# Patient Record
Sex: Female | Born: 1970
Health system: Southern US, Community
[De-identification: ages and names within clinical notes are randomized; demographics above are authoritative.]

## PROBLEM LIST (undated history)

## (undated) DIAGNOSIS — I10 Essential (primary) hypertension: Secondary | ICD-10-CM

## (undated) HISTORY — PX: FOOT FRACTURE SURGERY: SHX645

## (undated) HISTORY — DX: Essential (primary) hypertension: I10

## (undated) HISTORY — PX: FRACTURE SURGERY: SHX138

---

## 1998-11-08 ENCOUNTER — Other Ambulatory Visit: Admission: RE | Admit: 1998-11-08 | Discharge: 1998-11-08 | Payer: Self-pay | Admitting: Obstetrics and Gynecology

## 2000-03-21 ENCOUNTER — Other Ambulatory Visit: Admission: RE | Admit: 2000-03-21 | Discharge: 2000-03-21 | Payer: Self-pay | Admitting: Obstetrics and Gynecology

## 2002-06-05 ENCOUNTER — Emergency Department (HOSPITAL_COMMUNITY): Admission: EM | Admit: 2002-06-05 | Discharge: 2002-06-05 | Payer: Self-pay | Admitting: Emergency Medicine

## 2002-06-05 ENCOUNTER — Encounter: Payer: Self-pay | Admitting: Emergency Medicine

## 2003-06-03 ENCOUNTER — Emergency Department (HOSPITAL_COMMUNITY): Admission: EM | Admit: 2003-06-03 | Discharge: 2003-06-03 | Payer: Self-pay | Admitting: Emergency Medicine

## 2003-06-03 ENCOUNTER — Encounter: Payer: Self-pay | Admitting: Emergency Medicine

## 2003-11-16 ENCOUNTER — Encounter: Admission: RE | Admit: 2003-11-16 | Discharge: 2003-11-16 | Payer: Self-pay | Admitting: Family Medicine

## 2003-11-18 ENCOUNTER — Encounter: Admission: RE | Admit: 2003-11-18 | Discharge: 2003-11-18 | Payer: Self-pay | Admitting: Sports Medicine

## 2003-11-30 ENCOUNTER — Encounter: Admission: RE | Admit: 2003-11-30 | Discharge: 2003-11-30 | Payer: Self-pay | Admitting: Family Medicine

## 2003-12-18 ENCOUNTER — Emergency Department (HOSPITAL_COMMUNITY): Admission: EM | Admit: 2003-12-18 | Discharge: 2003-12-18 | Payer: Self-pay | Admitting: Emergency Medicine

## 2003-12-25 ENCOUNTER — Encounter: Admission: RE | Admit: 2003-12-25 | Discharge: 2003-12-25 | Payer: Self-pay | Admitting: Sports Medicine

## 2004-01-12 ENCOUNTER — Encounter: Admission: RE | Admit: 2004-01-12 | Discharge: 2004-01-12 | Payer: Self-pay | Admitting: Obstetrics and Gynecology

## 2004-01-19 ENCOUNTER — Ambulatory Visit (HOSPITAL_COMMUNITY): Admission: RE | Admit: 2004-01-19 | Discharge: 2004-01-19 | Payer: Self-pay | Admitting: *Deleted

## 2004-02-29 ENCOUNTER — Ambulatory Visit (HOSPITAL_COMMUNITY): Admission: RE | Admit: 2004-02-29 | Discharge: 2004-02-29 | Payer: Self-pay | Admitting: Obstetrics & Gynecology

## 2004-03-22 ENCOUNTER — Encounter: Admission: RE | Admit: 2004-03-22 | Discharge: 2004-03-22 | Payer: Self-pay | Admitting: Obstetrics and Gynecology

## 2004-06-13 ENCOUNTER — Ambulatory Visit: Payer: Self-pay | Admitting: Family Medicine

## 2004-09-21 ENCOUNTER — Ambulatory Visit: Payer: Self-pay | Admitting: Family Medicine

## 2004-10-04 ENCOUNTER — Ambulatory Visit: Payer: Self-pay | Admitting: Family Medicine

## 2004-10-12 ENCOUNTER — Encounter (INDEPENDENT_AMBULATORY_CARE_PROVIDER_SITE_OTHER): Payer: Self-pay | Admitting: *Deleted

## 2004-10-20 ENCOUNTER — Other Ambulatory Visit: Admission: RE | Admit: 2004-10-20 | Discharge: 2004-10-20 | Payer: Self-pay | Admitting: Family Medicine

## 2004-10-20 ENCOUNTER — Ambulatory Visit: Payer: Self-pay | Admitting: Family Medicine

## 2004-12-28 ENCOUNTER — Emergency Department (HOSPITAL_COMMUNITY): Admission: EM | Admit: 2004-12-28 | Discharge: 2004-12-28 | Payer: Self-pay | Admitting: Family Medicine

## 2005-02-23 ENCOUNTER — Ambulatory Visit: Payer: Self-pay | Admitting: Family Medicine

## 2006-04-06 ENCOUNTER — Ambulatory Visit: Payer: Self-pay | Admitting: Family Medicine

## 2006-11-08 DIAGNOSIS — F17200 Nicotine dependence, unspecified, uncomplicated: Secondary | ICD-10-CM | POA: Insufficient documentation

## 2006-11-08 DIAGNOSIS — M4150 Other secondary scoliosis, site unspecified: Secondary | ICD-10-CM

## 2006-11-08 DIAGNOSIS — J309 Allergic rhinitis, unspecified: Secondary | ICD-10-CM | POA: Insufficient documentation

## 2006-11-09 ENCOUNTER — Encounter (INDEPENDENT_AMBULATORY_CARE_PROVIDER_SITE_OTHER): Payer: Self-pay | Admitting: *Deleted

## 2007-04-24 ENCOUNTER — Ambulatory Visit: Payer: Self-pay | Admitting: Internal Medicine

## 2007-04-24 LAB — CONVERTED CEMR LAB
Alkaline Phosphatase: 67 units/L (ref 39–117)
Basophils Absolute: 0 10*3/uL (ref 0.0–0.1)
Basophils Relative: 0 % (ref 0–1)
CO2: 20 meq/L (ref 19–32)
Cholesterol: 162 mg/dL (ref 0–200)
Eosinophils Absolute: 0.2 10*3/uL (ref 0.0–0.7)
Eosinophils Relative: 1 % (ref 0–5)
Glucose, Bld: 75 mg/dL (ref 70–99)
HCT: 51 % — ABNORMAL HIGH (ref 36.0–46.0)
HDL: 54 mg/dL (ref >39)
Hemoglobin: 17.6 g/dL — ABNORMAL HIGH (ref 12.0–15.0)
MCHC: 34.5 g/dL (ref 30.0–36.0)
MCV: 96.4 fL (ref 78.0–100.0)
Neutrophils Relative %: 66 % (ref 43–77)
Potassium: 4.7 meq/L (ref 3.5–5.3)
RBC: 5.29 M/uL — ABNORMAL HIGH (ref 3.87–5.11)
RDW: 13.1 % (ref 11.5–14.0)
Sodium: 139 meq/L (ref 135–145)
Total CHOL/HDL Ratio: 3
Triglycerides: 189 mg/dL — ABNORMAL HIGH (ref <150)
VLDL: 38 mg/dL (ref 0–40)
WBC: 11.1 10*3/uL — ABNORMAL HIGH (ref 4.0–10.5)

## 2011-01-27 NOTE — Group Therapy Note (Signed)
[name], [name]                           ACCOUNT NO.:  [id number]   MEDICAL RECORD NO.:  [id number]                   PATIENT TYPE:  OUT   LOCATION:  [facility]                           FACILITY:  [facility]   PHYSICIAN:  Elsie Lincoln, MD                   DATE OF BIRTH:  04-Feb-1971   DATE OF SERVICE:  01/12/2004                                    CLINIC NOTE   HISTORY:  Patient a 40 year old para 1-0-1-1 female LMP December 31, 2003 who  presents for a presurgical evaluation for laparoscopic bilateral tubal  ligation.  Patient has been on OCPs for 11 years and desires no more  children however, she is a smoker so she can no longer take this method of  birth control.  She refuses IUD, condoms, diaphragm, NuvaRing.  She truly  wants to have a BTL.  Patient understands this is meant to be permanent and  not ever meant to be reversed and patient agrees this is her desires.   PAST MEDICAL HISTORY:  Denies.   PAST SURGICAL HISTORY:  Bone spurs in the heels removed and no problems with  anesthesia.   GYNECOLOGIC HISTORY:  No sexually transmitted diseases; one abnormal Pap  smear 1994 and underwent subsequent cryo - normal Pap smear since; no  fibroids; questionable history of ovarian cysts but no treatment for these.   ALLERGIES:  No known drug allergies.   MEDICATIONS:  OCPs.   SOCIAL HISTORY:  Smokes one pack per day tobacco, no alcohol or drugs.  No  problems with depression.  Not working and currently separated from her  husband.   ASSESSMENT AND PLAN:  [age]-year-old female para 1-0-1-1 desiring  permanent sterilization and evaluation of left lower quadrant pain that  patient has been experiencing for 6-8 months.   1. Transvaginal ultrasound to evaluate left adnexa.  2. Schedule for laparoscopic bilateral tubal ligation and diagnostic     laparoscopy, possible lysis of adhesions.  3. Thirty-day paper signed.                                               Elsie Lincoln, MD    KL/MEDQ  D:  01/12/2004  T:  01/13/2004  Job:  [id number]

## 2011-01-27 NOTE — Op Note (Signed)
NAME:  [name], [name]                         ACCOUNT NO.:  [id number]   MEDICAL RECORD NO.:  [id number]                   PATIENT TYPE:  AMB   LOCATION:  [facility]                                  [facility]ACILITY[facility]  [facility]   PHYSICIAN:  Lesly Dukes, M.D.              DATE OF BIRTH:  1970-11-29   DATE OF PROCEDURE:  02/29/2004  DATE OF DISCHARGE:                                 OPERATIVE REPORT   PREOPERATIVE DIAGNOSIS:  40 year old para 1-0-1-1, desiring permanent  sterilization as well as having intermittent left lower quadrant pain for 6-  8 months and a history of ovarian cyst.   POSTOPERATIVE DIAGNOSIS:  40 year old para 1-0-1-1, desiring permanent  sterilization as well as having intermittent left lower quadrant pain for 6-  8 months and a history of ovarian cyst.   PROCEDURE:  Laparoscopic bilateral tubal ligation and diagnostic  laparoscopy.   SURGEON:  Lesly Dukes, M.D.   ASSISTANT:  Shelbie Proctor. Shawnie Pons, M.D.   ANESTHESIA:  General.   ESTIMATED BLOOD LOSS:  Less than 30 mL.   COMPLICATIONS:  None.   PATHOLOGY:  None.   FINDINGS:  Normal ovaries bilaterally.  Normal fallopian tubes bilaterally.  No evidence of adhesions or old inflammatory disease.  Normal uterus.  Mild  increased pelvic congestion, left greater than right in the IP, questionable  old scarring in the posterior cul-de-sac, this could be questionably old  endometriosis, the patient is menstruating so there is retrograde  menstruation present, grossly normal appendix and grossly normal liver, no  adhesions at all to speak of, the printer on the laparoscopy equipment was  not working and could not take pictures.   DESCRIPTION OF PROCEDURE:  After informed consent was obtained, the patient  was taken to the operating room where general anesthesia was found to be  adequate.  The patient was placed in the dorsal supine position and prepped  and draped in the normal sterile fashion.  A bivalve speculum was placed  into the vagina and a Hulka clip was placed on the anterior lip of the  cervix.  An infraumbilical skin incision was made with the scalpel and  carried down to the layer of the fascia.  The fascia was tented up and  entered sharply with a scalpel.  This incision was carried down to the  peritoneum which was identified, tented up, and entered sharply with the  Mayo scissors.  The S-retractor was then placed into the intraperitoneal  cavity and a blunt #10 trocar was placed into the abdomen.  The laparoscope  was introduced into the abdomen and intraperitoneal placement was confirmed.  A pneumoperitoneum was achieved with CO2 to a pressure of 15.  The patient  was placed in Trendelenburg.  Using the operative scope, a Hulka clip was  placed on each fallopian tube.  Placement was verified by following the  fallopian tube out to its fimbriated end.  As  described in the above  findings, a survey of the pelvic anatomy was performed.  There was no  evidence of adhesions as described above.  Questionably, old endometriosis  as described above.  No pictures could be taken secondary to the printer on  the laparoscopy equipment not working.  At this point, the procedure was  ended.  The pneumoperitoneum was released and all instruments were removed  from the abdomen.  The fascia was identified and closed with 0 Vicryl in a  large figure-of-eight.  The skin was closed with 4-0 Vicryl in a  subcuticular fashion.  Good hemostasis was noted.  The Hulka clip was  removed from the cervix and the cervix noted to be hemostatic.  The patient  tolerated the procedure well.  Sponge, lap, instrument, and needle counts  were correct x 2.  The patient went to the recovery room in stable  condition.                                               Lesly Dukes, M.D.    Lora Paula  D:  02/29/2004  T:  02/29/2004  Job:  [id number]

## 2011-01-27 NOTE — Group Therapy Note (Signed)
NAME:  [name], [name]                         ACCOUNT NO.:  [id number]   MEDICAL RECORD NO.:  [id number]                   PATIENT TYPE:  OUT   LOCATION:  [facility]                           FACILITY:  [facility]   PHYSICIAN:  Elsie Lincoln, MD                   DATE OF BIRTH:  1971-04-09   DATE OF SERVICE:  03/22/2004                                    CLINIC NOTE   REASON FOR VISIT:  This 40 year old para 1-0-0-1 female who had a  laparoscopic bilateral tubal ligation on February 29, 2004 presents for follow-  up.  She had minimal discomfort after the surgery that required some  Percocet; however, the patient is doing fine now.  Also during the procedure  she was having some left lower quadrant pain for 6-8 months and states she  had a history of ovarian cyst.  As per the operative note which is in her  chart there was really nothing found, maybe increased pelvic congestion on  the left, and questionable old scarring in the posterior cul-de-sac of old  endometriosis.  The patient does not have pain with menstruation and the  pain is erratic and such that there is no pattern to the pain.  She has not  had a bowel or bladder workup.  She does not desire to have this at this  time.  She was offered a referral to Dr. Saralyn Pilar in [facility] and she  also refused this at this time.  She said she will come back if the pain is  worsened.  She is just relieved that there is no pathology that needs to be  dealt with.  [facility] and will return there  in September for her Pap smear.                                               Elsie Lincoln, MD    KL/MEDQ  D:  03/22/2004  T:  03/22/2004  Job:  [id number]

## 2011-03-17 ENCOUNTER — Emergency Department (HOSPITAL_COMMUNITY): Payer: No Typology Code available for payment source

## 2011-03-17 ENCOUNTER — Emergency Department (HOSPITAL_COMMUNITY)
Admission: EM | Admit: 2011-03-17 | Discharge: 2011-03-17 | Disposition: A | Discharge status: Home / Self Care | Payer: No Typology Code available for payment source | Attending: Emergency Medicine | Admitting: Emergency Medicine

## 2011-03-17 DIAGNOSIS — T148XXA Other injury of unspecified body region, initial encounter: Secondary | ICD-10-CM | POA: Insufficient documentation

## 2011-03-17 DIAGNOSIS — R0789 Other chest pain: Secondary | ICD-10-CM | POA: Insufficient documentation

## 2011-03-17 DIAGNOSIS — M549 Dorsalgia, unspecified: Secondary | ICD-10-CM | POA: Insufficient documentation

## 2011-03-17 DIAGNOSIS — S20219A Contusion of unspecified front wall of thorax, initial encounter: Secondary | ICD-10-CM | POA: Insufficient documentation

## 2011-05-13 ENCOUNTER — Emergency Department (HOSPITAL_COMMUNITY): Payer: Self-pay

## 2011-05-13 ENCOUNTER — Inpatient Hospital Stay (HOSPITAL_COMMUNITY)
Admission: EM | Admit: 2011-05-13 | Discharge: 2011-05-19 | DRG: 494 | Disposition: A | Discharge status: Skilled Nursing Facility | Payer: MEDICAID | Attending: Orthopedic Surgery | Admitting: Orthopedic Surgery

## 2011-05-13 DIAGNOSIS — F1010 Alcohol abuse, uncomplicated: Secondary | ICD-10-CM | POA: Diagnosis present

## 2011-05-13 DIAGNOSIS — G4700 Insomnia, unspecified: Secondary | ICD-10-CM | POA: Diagnosis present

## 2011-05-13 DIAGNOSIS — S82109A Unspecified fracture of upper end of unspecified tibia, initial encounter for closed fracture: Principal | ICD-10-CM | POA: Diagnosis present

## 2011-05-13 DIAGNOSIS — Y92009 Unspecified place in unspecified non-institutional (private) residence as the place of occurrence of the external cause: Secondary | ICD-10-CM

## 2011-05-13 DIAGNOSIS — F17200 Nicotine dependence, unspecified, uncomplicated: Secondary | ICD-10-CM | POA: Diagnosis present

## 2011-05-13 DIAGNOSIS — Y998 Other external cause status: Secondary | ICD-10-CM

## 2011-05-13 DIAGNOSIS — E876 Hypokalemia: Secondary | ICD-10-CM | POA: Diagnosis present

## 2011-05-13 DIAGNOSIS — M948X9 Other specified disorders of cartilage, unspecified sites: Secondary | ICD-10-CM | POA: Diagnosis present

## 2011-05-13 DIAGNOSIS — W010XXA Fall on same level from slipping, tripping and stumbling without subsequent striking against object, initial encounter: Secondary | ICD-10-CM | POA: Diagnosis present

## 2011-05-13 LAB — BASIC METABOLIC PANEL
BUN: 9 mg/dL (ref 6–23)
CO2: 21 mEq/L (ref 19–32)
Calcium: 8.4 mg/dL (ref 8.4–10.5)
Chloride: 99 mEq/L (ref 96–112)
Creatinine, Ser: 0.62 mg/dL (ref 0.50–1.10)
GFR calc Af Amer: >60 mL/min (ref >60)
GFR calc Af Amer: >60 mL/min (ref >60)
GFR calc non Af Amer: >60 mL/min (ref >60)
Glucose, Bld: 116 mg/dL — ABNORMAL HIGH (ref 70–99)
Glucose, Bld: 103 mg/dL — ABNORMAL HIGH (ref 70–99)
Potassium: 2.7 mEq/L — CL (ref 3.5–5.1)
Potassium: 3.7 mEq/L (ref 3.5–5.1)
Sodium: 138 mEq/L (ref 135–145)

## 2011-05-13 LAB — URINALYSIS, ROUTINE W REFLEX MICROSCOPIC
Bilirubin Urine: NEGATIVE
Glucose, UA: NEGATIVE mg/dL
Specific Gravity, Urine: 1.011 (ref 1.005–1.030)
pH: 5.5 (ref 5.0–8.0)

## 2011-05-13 LAB — CBC
HCT: 42.4 % (ref 36.0–46.0)
MCH: 34.1 pg — ABNORMAL HIGH (ref 26.0–34.0)
MCV: 95.7 fL (ref 78.0–100.0)
Platelets: 185 10*3/uL (ref 150–400)
RBC: 4.43 MIL/uL (ref 3.87–5.11)

## 2011-05-13 LAB — DIFFERENTIAL
Eosinophils Absolute: 0.1 10*3/uL (ref 0.0–0.7)
Lymphocytes Relative: 20 % (ref 12–46)
Lymphs Abs: 2.2 10*3/uL (ref 0.7–4.0)
Monocytes Relative: 4 % (ref 3–12)
Neutrophils Relative %: 75 % (ref 43–77)

## 2011-05-13 LAB — URINE MICROSCOPIC-ADD ON

## 2011-05-14 LAB — URINE CULTURE: Culture  Setup Time: 201209011126

## 2011-05-15 ENCOUNTER — Inpatient Hospital Stay (HOSPITAL_COMMUNITY): Payer: Self-pay

## 2011-05-16 ENCOUNTER — Inpatient Hospital Stay (HOSPITAL_COMMUNITY): Payer: Self-pay

## 2011-05-16 ENCOUNTER — Other Ambulatory Visit (HOSPITAL_COMMUNITY): Payer: No Typology Code available for payment source

## 2011-05-16 LAB — BASIC METABOLIC PANEL
BUN: 5 mg/dL — ABNORMAL LOW (ref 6–23)
Calcium: 9.2 mg/dL (ref 8.4–10.5)
Potassium: 4.3 mEq/L (ref 3.5–5.1)

## 2011-05-16 LAB — CBC
HCT: 37.7 % (ref 36.0–46.0)
MCHC: 35.3 g/dL (ref 30.0–36.0)
RDW: 13 % (ref 11.5–15.5)

## 2011-05-16 LAB — SURGICAL PCR SCREEN
MRSA, PCR: NEGATIVE
Staphylococcus aureus: NEGATIVE

## 2011-05-17 LAB — COMPREHENSIVE METABOLIC PANEL
AST: 26 U/L (ref 0–37)
Albumin: 2.6 g/dL — ABNORMAL LOW (ref 3.5–5.2)
Calcium: 9.4 mg/dL (ref 8.4–10.5)
Chloride: 98 mEq/L (ref 96–112)
Creatinine, Ser: 0.57 mg/dL (ref 0.50–1.10)
Total Protein: 6.4 g/dL (ref 6.0–8.3)

## 2011-05-17 LAB — PREALBUMIN: Prealbumin: 11 mg/dL — ABNORMAL LOW (ref 17.0–34.0)

## 2011-05-18 LAB — IRON AND TIBC
Iron: 40 ug/dL — ABNORMAL LOW (ref 42–135)
Saturation Ratios: 14 % — ABNORMAL LOW (ref 20–55)
UIBC: 243 ug/dL (ref 125–400)

## 2011-05-24 NOTE — H&P (Signed)
  NAMEAAMYA, [name] NO.:  [id number]  MEDICAL RECORD NO.:  [id number]  LOCATION:  [facility]                         FACILITY:  [facility]  PHYSICIAN:  Burnard Bunting, M.D.    DATE OF BIRTH:  1970-12-06  DATE OF ADMISSION:  05/13/2011 DATE OF DISCHARGE:                             HISTORY & PHYSICAL   CHIEF COMPLAINT:  Left leg pain.  HISTORY OF PRESENT ILLNESS:  [name] is a 40 year old patient with left leg pain, as she had a fall this morning in her house.  Denies any loss of consciousness.  Reports no other orthopedic complaints other than left leg pain, inability to weightbear.  Denies numbness or tingling.  PAST MEDICAL HISTORY:  Negative.  PAST SURGICAL HISTORY:  Negative.  ALLERGIES:  She is allergic to CODEINE.  SOCIAL HISTORY:  The patient does report positive EtOH use, smoking about one pack per day.  Has family in [place].  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  REVIEW OF SYSTEMS:  All other systems reviewed and negative except right to left leg.  PHYSICAL EXAMINATION:  GENERAL:  She is well-developed, well-nourished in no acute distress.  Alert and oriented. VITAL SIGNS:  Blood pressure 158/90, respirations 14, heart rate 80. CHEST:  Clear to auscultation. HEART:  Regular rate and rhythm. ABDOMEN:  Benign. EXTREMITIES:  Left extremity compartments are soft anterior and posterior.  Some swelling is present.  No pain with passive dorsiflexion and plantar flexion of the toes.  DP pulse 2+/4.  Sensation is okay on the dorsal plantar aspect of the foot.  No groin pain in internal extension of the leg.  The right lower extremity, bilateral upper extremities have full range of motion without pain.  X-rays show comminuted bicondylar tibial plateau fracture, distal femur is intact.  Chest x-ray is negative.  CT scan that confirmed the significant comminution.  UA is negative.  Sodium and potassium are  134 and 2.7.  BUN and creatinine 9 and 0.62.  EtOH level 119.  White count 11,000, hemoglobin 15, platelets 185.  IMPRESSION:  Left tibial plateau fracture and hypokalemia.  PLAN:  Supplementation in the ER of her hypokalemia with subsequent external fixation and reduction of the left tibial plateau fracture.  We will keep her on bilateral foot puncture DVT prophylaxis due to the concern about precipitating compartment syndrome with pharmacologic DVT prophylaxis.  I have discussed the case with Dr. Daneil [name] who will take care of her on Tuesday with internal fixation would be performed at that time.     Burnard Bunting, M.D.     GSD/MEDQ  D:  05/13/2011  T:  05/13/2011  Job:  [id number]  Electronically Signed by [name].  [name] M.D. on 05/24/2011 08:34:20 AM

## 2011-05-24 NOTE — Op Note (Signed)
  [name], [name]               ACCOUNT NO.:  [id number]  MEDICAL RECORD NO.:  [id number]  LOCATION:  [facility]                         FACILITY:  [facility]  PHYSICIAN:  Burnard Bunting, M.D.    DATE OF BIRTH:  1970/09/28  DATE OF PROCEDURE:  05/13/2011 DATE OF DISCHARGE:                              OPERATIVE REPORT   PREOPERATIVE DIAGNOSIS:  Left tibial plateau fracture.  POSTOPERATIVE DIAGNOSIS:  Left tibial plateau fracture.  PROCEDURE:  Reduction and external fixation, left tibial plateau fracture.  SURGEON:  Burnard Bunting, MD  ASSISTANT:  None.  ANESTHESIA:  General endotracheal.  ESTIMATED BLOOD LOSS:  Minimal.  INDICATION:  The patient fell and had comminuted bicondylar tibial plateau fracture on the left, presents now for temporizing external fixation, pinning at the tendon of internal fixation.  PROCEDURE IN DETAIL:  The patient was brought to the operating room where general endotracheal was used.  Preoperative antibiotics administered.  Left leg was pre-scrubbed with alcohol and Betadine, which was allowed to air dry, prepped with DuraPrep solution and draped in a sterile manner.  Time-out was called.  Two pins were then placed about 6-7 cm below the tibial shaft fracture, 2 pins were then placed at the proximal lateral outside of the knee joint into the femoral shaft. Correct location was confirmed using fluoroscopic guidance.  The pins were placed, external fixation was then placed, and the fracture was reduced.  Part of our tendon bar connections were then tightened.  Good reduction was achieved.  The incision was then applied to the pin sites to the skin.  Bulky dressing was applied.  The patient tolerated the procedure well without immediate complications.     Burnard Bunting, M.D.     GSD/MEDQ  D:  05/13/2011  T:  05/13/2011  Job:  [id number]  Electronically Signed by Reece Agar.  DEAN M.D. on 05/24/2011 08:34:23 AM

## 2011-05-31 ENCOUNTER — Encounter (HOSPITAL_COMMUNITY)
Admission: RE | Admit: 2011-05-31 | Discharge: 2011-05-31 | Disposition: A | Discharge status: Home / Self Care | Payer: No Typology Code available for payment source | Source: Ambulatory Visit | Attending: Orthopedic Surgery | Admitting: Orthopedic Surgery

## 2011-05-31 LAB — CBC
HCT: 42.9 % (ref 36.0–46.0)
Hemoglobin: 14.4 g/dL (ref 12.0–15.0)
MCH: 32.7 pg (ref 26.0–34.0)
MCV: 97.5 fL (ref 78.0–100.0)
RBC: 4.4 MIL/uL (ref 3.87–5.11)

## 2011-05-31 LAB — URINALYSIS, ROUTINE W REFLEX MICROSCOPIC
Glucose, UA: NEGATIVE mg/dL
Protein, ur: NEGATIVE mg/dL
pH: 5 (ref 5.0–8.0)

## 2011-05-31 LAB — COMPREHENSIVE METABOLIC PANEL
ALT: 17 U/L (ref 0–35)
AST: 24 U/L (ref 0–37)
Albumin: 3.5 g/dL (ref 3.5–5.2)
CO2: 26 mEq/L (ref 19–32)
Calcium: 9.7 mg/dL (ref 8.4–10.5)
GFR calc non Af Amer: >60 mL/min (ref >60)
Sodium: 138 mEq/L (ref 135–145)
Total Protein: 6.5 g/dL (ref 6.0–8.3)

## 2011-05-31 LAB — HCG, SERUM, QUALITATIVE: Preg, Serum: NEGATIVE

## 2011-05-31 LAB — URINE MICROSCOPIC-ADD ON

## 2011-05-31 LAB — DIFFERENTIAL
Lymphs Abs: 3.2 10*3/uL (ref 0.7–4.0)
Monocytes Relative: 5 % (ref 3–12)
Neutro Abs: 9.8 10*3/uL — ABNORMAL HIGH (ref 1.7–7.7)
Neutrophils Relative %: 70 % (ref 43–77)

## 2011-05-31 LAB — PROTIME-INR: Prothrombin Time: 12.9 seconds (ref 11.6–15.2)

## 2011-06-01 ENCOUNTER — Other Ambulatory Visit (HOSPITAL_COMMUNITY): Payer: No Typology Code available for payment source

## 2011-06-01 ENCOUNTER — Inpatient Hospital Stay (HOSPITAL_COMMUNITY)
Admission: RE | Admit: 2011-06-01 | Discharge: 2011-06-04 | DRG: 488 | Disposition: A | Discharge status: Home Health | Payer: Self-pay | Source: Ambulatory Visit | Attending: Orthopedic Surgery | Admitting: Orthopedic Surgery

## 2011-06-01 ENCOUNTER — Inpatient Hospital Stay (HOSPITAL_COMMUNITY): Payer: Self-pay

## 2011-06-01 DIAGNOSIS — F17200 Nicotine dependence, unspecified, uncomplicated: Secondary | ICD-10-CM | POA: Diagnosis present

## 2011-06-01 DIAGNOSIS — S82209A Unspecified fracture of shaft of unspecified tibia, initial encounter for closed fracture: Secondary | ICD-10-CM | POA: Diagnosis present

## 2011-06-01 DIAGNOSIS — Z4789 Encounter for other orthopedic aftercare: Secondary | ICD-10-CM

## 2011-06-01 DIAGNOSIS — M23302 Other meniscus derangements, unspecified lateral meniscus, unspecified knee: Secondary | ICD-10-CM | POA: Diagnosis present

## 2011-06-01 DIAGNOSIS — W1789XA Other fall from one level to another, initial encounter: Secondary | ICD-10-CM | POA: Diagnosis present

## 2011-06-01 DIAGNOSIS — N390 Urinary tract infection, site not specified: Secondary | ICD-10-CM | POA: Diagnosis present

## 2011-06-01 DIAGNOSIS — Y92009 Unspecified place in unspecified non-institutional (private) residence as the place of occurrence of the external cause: Secondary | ICD-10-CM

## 2011-06-01 DIAGNOSIS — S82109A Unspecified fracture of upper end of unspecified tibia, initial encounter for closed fracture: Principal | ICD-10-CM | POA: Diagnosis present

## 2011-06-01 DIAGNOSIS — Z01812 Encounter for preprocedural laboratory examination: Secondary | ICD-10-CM

## 2011-06-01 LAB — URINALYSIS, ROUTINE W REFLEX MICROSCOPIC
Glucose, UA: NEGATIVE mg/dL
Specific Gravity, Urine: 1.024 (ref 1.005–1.030)
pH: 5 (ref 5.0–8.0)

## 2011-06-01 LAB — URINE MICROSCOPIC-ADD ON

## 2011-06-01 LAB — URINE CULTURE

## 2011-06-02 LAB — CBC
MCH: 31.8 pg (ref 26.0–34.0)
MCHC: 32 g/dL (ref 30.0–36.0)
MCV: 99.4 fL (ref 78.0–100.0)
Platelets: 380 10*3/uL (ref 150–400)
RDW: 14.6 % (ref 11.5–15.5)

## 2011-06-02 LAB — BASIC METABOLIC PANEL
Calcium: 8.4 mg/dL (ref 8.4–10.5)
Creatinine, Ser: <0.47 mg/dL — ABNORMAL LOW (ref 0.50–1.10)

## 2011-06-03 LAB — BASIC METABOLIC PANEL
BUN: 5 mg/dL — ABNORMAL LOW (ref 6–23)
Calcium: 8.4 mg/dL (ref 8.4–10.5)
Chloride: 105 mEq/L (ref 96–112)
Creatinine, Ser: <0.47 mg/dL — ABNORMAL LOW (ref 0.50–1.10)

## 2011-06-03 LAB — PROTIME-INR: Prothrombin Time: 15.5 seconds — ABNORMAL HIGH (ref 11.6–15.2)

## 2011-06-03 LAB — CBC
HCT: 27.8 % — ABNORMAL LOW (ref 36.0–46.0)
MCHC: 32.7 g/dL (ref 30.0–36.0)
RDW: 14.9 % (ref 11.5–15.5)

## 2011-06-04 LAB — PROTIME-INR: INR: 1.73 — ABNORMAL HIGH (ref 0.00–1.49)

## 2011-06-19 ENCOUNTER — Ambulatory Visit (HOSPITAL_COMMUNITY)
Admission: RE | Admit: 2011-06-19 | Discharge: 2011-06-19 | Disposition: A | Discharge status: Home / Self Care | Payer: Self-pay | Source: Ambulatory Visit | Attending: Orthopedic Surgery | Admitting: Orthopedic Surgery

## 2011-06-19 DIAGNOSIS — M7989 Other specified soft tissue disorders: Secondary | ICD-10-CM

## 2011-06-19 DIAGNOSIS — M79609 Pain in unspecified limb: Secondary | ICD-10-CM

## 2011-06-20 NOTE — Op Note (Signed)
NAMEBRANDACE, [name]               ACCOUNT NO.:  [id number]  MEDICAL RECORD NO.:  [id number]  LOCATION:  5011                         FACILITY:  [facility]  PHYSICIAN:  Doralee Albino. Carola Frost, M.D. DATE OF BIRTH:  08-Nov-1970  DATE OF PROCEDURE:  05/16/2011 DATE OF DISCHARGE:                              OPERATIVE REPORT   PREOPERATIVE DIAGNOSIS:  Left Schatzker 6 tibial plateau fracture, status post external fixation.  POSTOPERATIVE DIAGNOSIS:  Left Schatzker 6 tibial plateau fracture, status post external fixation.  PROCEDURE:  Revision external fixation, left leg.  SURGEON:  Doralee Albino. Carola Frost, MD  ASSISTANT:  Mearl Latin, PA  ANESTHESIA:  General.  COMPLICATIONS:  None.  ESTIMATED BLOOD LOSS:  Minimal.  DISPOSITION:  PACU.  CONDITION:  Stable.  BRIEF SUMMARY AND INDICATION FOR PROCEDURE:  [name] is a 40 year old female status post severely comminuted left tibial plateau fracture treated initially by Dr. Dorene Grebe.  Subsequent x-rays showed overall reasonable alignment, but with a close proximity of the more proximal of the two tibial pins to the fracture site.  Also, the more distal of the 2 femoral clamps was quite close to the skin following her progressive swelling.  Consequently, we discussed with her the need for revision external fixation to change one of the pins and also change a clamp. The patient did wish to proceed with this.  Because of the construct, she did require an this to be done in the OR.  She understood those complications to include pin tract infection, nerve injury, vessel injury, need for further surgery, DVT, PE, and others.  BRIEF SUMMARY OF PROCEDURE:  [name] was administered a gram of Ancef and taken to the operating room where general anesthesia was induced. Her left lower extremity was prepped and draped in the usual sterile fashion.  A new ex fix pin was placed more distal far away from the fracture site than the more proximal of  the tibial pins.  The more proximal tibial pin was then removed.  The position of the external fixator pin was confirmed on AP and lateral projections of the C-arm. Reduction maneuver consisting of traction and manipulation was then performed to reduce the fracture site.  All of the clamps were then secured in place.  Additional bar with bar-bar construct was then placed to add additional control of the fracture.  Final images again showed appropriate reduction and alignment.  The patient was awakened from anesthesia and transported to PACU in stable condition after an irrigation and simple nylon closure of the ex fix pin sites and application of sterile dressing.  PROGNOSIS:  [name] will be managed in the fixator until soft tissue swelling resolution has occurred and then she will return for definitive internal fixation.  CT scan need to be repeated and this was obtained prior to external fixation and a dual approach with the medial buttress plate for the posterior shear component as well as a lateral plate is anticipated.  She will be on pharmacologic DVT prophylaxis until that time.     Doralee Albino. Carola Frost, M.D.     MHH/MEDQ  D:  05/16/2011  T:  05/16/2011  Job:  [id number]  Electronically Signed by Myrene Galas M.D. on 06/20/2011 06:45:13 PM
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
## 2011-06-20 NOTE — Discharge Summary (Signed)
NAMECONTINA, STRAIN               ACCOUNT NO.:  [id number]  MEDICAL RECORD NO.:  [id number]  LOCATION:  5011                         FACILITY:  [facility]  PHYSICIAN:  Doralee Albino. Carola Frost, M.D. DATE OF BIRTH:  1971-06-09  DATE OF ADMISSION:  05/13/2011 DATE OF DISCHARGE:  05/18/2011                        DISCHARGE SUMMARY - REFERRING   DISCHARGE DIAGNOSES:  Left Schatzker VI tibial plateau fracture.  ADDITIONAL DISCHARGE DIAGNOSES: 1. Nicotine dependence. 2. Alcohol dependence. 3. Presumed metabolic bone disease.  PROCEDURES PERFORMED:  On May 13, 2011, spanning external fixation of left tibial plateau by Dr. Dorene Grebe.  May 16, 2011, revision of external fixation, left leg by Dr. Carola Frost.  BRIEF HISTORY AND HOSPITAL COURSE:  Ms. Gugel is a 40 year old Caucasian female, sustained ground-level fall on May 13, 2011.  She is brought to [facility] and was found to have a complex left tibial plateau fracture.  She was initially brought to the operating room by Dr. August Saucer for reduction and application of external fixator. Given the complexity of the injury, Orthopedic Trauma Service was consulted for definitive management.  She was seen and evaluated on May 16, 2011 and was found to be somewhat short.  With respect to her fracture, she was brought back to the operating room for revision of her external fixation with addition of additional carbon fiber rods to provide additional stability to reduction.  The patient was brought back to the orthopedic floor for continued observation and pain control as well as the physical therapy for mobilization.  The patient was started on Lovenox as well for DVT prophylaxis.  She was also started on admission on the Ativan protocol for DVT prophylaxis.  The patient's hospital stay was relatively uncomplicated.  However, given her limited social support, she was deemed to be a candidate for a skilled nursing facility as we  awaited soft tissue healing for eventual definitive ORIF. The patient was also fitted with a Orthoplast footplate to maintain her ankle in a plantigrade position to prevent development of equinus contracture.  Again, given the patient's social history of heavy smoking and alcohol use, we initiated a metabolic bone workup given the relatively low-energy mechanism at current time.  TSH intact.  PTH and vitamin D panel is pending as is a anemia panel.  At current time, her prealbumin and albumin are both depressed or decreased.  Prealbumin is 11.0 and albumin is 2.6.  Overall, the patient is doing very well.  He was deemed to be stable for discharge on May 18, 2011 to a short- term nursing center while we await soft tissue healing before proceeding with definitive ORIF.  Given the complexity of the fracture involving both condyles as well as a posterior shear to the medial tibial plateau, the patient will require a dual plating for restoration of stability and will also require fairly significant bone grafting given the significant depression present as well.  Clinical encounter note for postoperative day #2 is as follows.  OBJECTIVE:  The patient is doing better.  No acute changes. VITAL SIGNS:  Temperature 98.0, heart rate 89, respirations 80-98% on room air, BP is 144/83.  Prealbumin is 11.0, ionized calcium is normal at 1.22, albumin  is low at 2.6, phosphorus is normal at 4.4, magnesium is normal at 2.1. GENERAL:  The patient is sitting in bedside chair, in no acute distress, appears to be very comfortable. LUNGS:  Clear bilaterally. CARDIAC:  S1 and S2 noted. ABDOMEN:  Soft, nontender with positive bowel sounds. EXTREMITIES:  Left lower extremity dressing is stable.  Pin site is excellent.  Ex-Fix is stable.  Deep peroneal nerve, superficial peroneal nerve, and tibial nerve sensory function are intact.  EHL, FHL, anterior tibialis, posterior tibialis, peroneals, gastroc-soleus  complex motor function intact.  No deep calf tenderness.  Compartments soft and nontender.  No pain with passive stretch.  ASSESSMENT/PLAN:  A 40 year old female status post fall. 1. Left bicondylar tibial plateau fracture status post external     fixation, possible with nonweightbearing.  The patient will require     soft tissue rest in about 10-14 days for definitive plate     osteosynthesis.  The patient is stable at current time for     discharge to a short-term skilled nursing facility.  The patient is     encouraged to perform toe and ankle range of motion as tolerated.     Footplate is to not working on range of motion, aggressive ice and     elevation as well as Ace wrap. 2. Nicotine dependence.  No supplements.  Encourage cessation. 3. Ethyl alcohol dependence.  The patient completed Ativan protocol.     Continue monitoring. 4. Pain.  Continue current regimen. 5. SVN.  Continue regular diet.  The patient likely requires some     additional nutritional support such as meal supplementation shakes     b.i.d. or t.i.d. with meals to help restore nutrients and to     encourage healing.  We will start the patient on vitamin C 500 mg     p.o. daily, vitamin D3 1000 international units p.o. b.i.d., and     calcium citrate 600 mg p.o. b.i.d.  Once metabolic bone panel is     completed, we will adjust accordingly. 6. Pain.  Continue her current regimen. 7. Deep vein thrombosis and pulmonary embolism, prophylaxis.  Continue     with Lovenox 40 mg subcu daily for DVT, PE prophylaxis. 8. Disposition.  The patient is stable for SNF.  Follow up with     Orthopedics in 7 days for soft tissue check and scheduling for     surgery.  DISCHARGE MEDICATIONS: 1. Percocet 5/325 one to two p.o. q.6 h. as needed for pain. 2. Oxycodone 5 mg 1-2 p.o. q.3 h. as needed for breakthrough pain. 3. Calcium citrate 600 mg 1 p.o. b.i.d. 4. Lovenox 40 mg 1 subcutaneous injection daily. 5. Robaxin 500 mg 1-2  p.o. q.8 h. as needed. 6. Multivitamin 1 p.o. daily. 7. Vitamin C 500 mg 1 p.o. daily. 8. Vitamin D3 1000 IUs p.o. b.i.d.  DISCHARGE INSTRUCTIONS AND PLAN:  Mrs. Dieu sustained a very severe injury to her left tibial plateau with extensive joint involvement including severe comminution and depression over joint.  She is at increased risk for the development of severe post-traumatic arthritis. However, we are hopeful that her soft tissue recover with an extended period of time, so that we may proceed with definitive ORIF of her left tibial plateau to help restore stability alignment, joint surface congruity, and evaluate her meniscus need be, so the effects of any post- traumatic arthritis are limited.  The patient will be nonweightbearing after definitive fixation for 8 weeks at least  given her extensive smoking and alcohol use history.  I would expect complete union to occur around 12-week mark.  The patient will be nonweightbearing for the time being while she is in the external fixator as well.  Aggressive ice elevation, Ace wrap should be maintained to help with swelling resolution as well.  The patient remained on Lovenox for DVT and PE prophylaxis during this soft tissue healing phase and will likely be on Lovenox versus Coumadin after surgery.  Depending on the level of mobility, we will make that determination at the time of surgery shortly thereafter.  The patient will continue on a regular diet as well.  She is tolerating this and has been voiding well and has had bowel movement. The patient will continue to work with physical therapy even though she is in an external fixator.  I want the patient be mobile as possible, bed to chair as well as ambulating as well.  Again, nonweightbearing on her left leg.  Again, we are checking a metabolic bone panel to evaluate for any additional secondary causes for her poor density.  The patient is relatively young, if she has degree of  osteoporosis is likely not primary and is likely related to her smoking and alcohol use, but again, I would like to evaluate for any additional endocrine dysfunction or other potential causes.  The patient does not appear to have any renal disease contributing to poor bone density and again is likely therefore secondary to modifiable factors such as smoking and alcohol abuse.  The patient will remain on supplementation with vitamin D, calcium, and we will adjust accordingly to labs.  With respect to wound care, the patient should have daily pin site care performed at the nursing home, which should include cleaning of the pin sites with soap and water, removal of any coagulant from the pin sites to allow for drainage.  No ointments or Xeroform should be applied to the pin sites, so as to allow adequate drainage.  Pin site should be wrapped with Kerlix gauze from skin all the way up to the pin bar interface.  This will help limit pin motion at the skin.  Legs should be also wrapped with an Ace wrap from foot to side as well.  Should the nursing home have any questions prior to her followup or during any or for any reason whatsoever regarding soft-tissue tear and external fixator care.  They are to contact the office.  It is okay for the patient to be removed by her Ex-Fix as well to facilitate mobilization.     Mearl Latin, PA   ______________________________ Doralee Albino. Carola Frost, M.D.    KWP/MEDQ  D:  05/18/2011  T:  05/18/2011  Job:  [id number]  Electronically Signed by Montez Morita PA on 05/29/2011 02:05:41 PM Electronically Signed by Myrene Galas M.D. on 06/20/2011 06:45:18 PM

## 2011-06-20 NOTE — Consult Note (Signed)
NAMESENA, HOOPINGARNER               ACCOUNT NO.:  [id number]  MEDICAL RECORD NO.:  [id number]  LOCATION:  5011                         FACILITY:  [facility]  PHYSICIAN:  Doralee Albino. Carola Frost, M.D. DATE OF BIRTH:  1971/06/07  DATE OF CONSULTATION:  05/16/2011 DATE OF DISCHARGE:                                CONSULTATION   REQUESTING PHYSICIAN:  Burnard Bunting, MD, Orthopedics.  REASON FOR CONSULTATION:  Complex left tibial plateau fracture.  BRIEF HISTORY OF PRESENT ILLNESS:  Ms. Rabelo is a 40 year old Caucasian female with a history of nicotine and alcohol use, who was at home on May 13, 2011, when she reportedly jumped off her deck, landed on her left leg in the rocks beneath her, gave way.  She sustained injury to her left lower extremity.  The patient was brought to [facility] for evaluation.  She was seen and evaluated by Dr. August Saucer in the emergency department.  It was noted that the patient did have a blood alcohol level of 118.  Given the severe comminution and complexity of her tibial plateau fracture as well as soft tissue swelling, she was taken urgently to the operating room for placement of external fixator to help restore length and stabilize her fracture.  The patient was also started on DT prophylaxis as well.  Given the complexity of the injury, the Orthopedic Trauma Service and Dr. Carola Frost was consulted for definitive management for her fracture.  Currently, Ms. Robinson is in room 5011 and complains of left lower extremity pain.  Denies injuries elsewhere.  No additional complaints are noted.  She denies any chest pain or shortness of breath.  No nausea, vomiting.  No recent illnesses.  She denies any numbness or tingling in her left lower extremity.  Reports that motor and sensory functions are intact in her left lower extremity as well.  PAST MEDICAL HISTORY:  The patient denies.  PAST SURGICAL HISTORY:  The patient does have pain in her right foot secondary  to previous fracture.  She has had bone spurs from her heels and wound as well.  ALLERGIES:  Reports allergy to CODEINE.  SOCIAL HISTORY:  The patient does use alcohol, she drinks 2-3 days a week, 4-5 beers at a time.  She does smoke approximately one pack per day.  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 Currently not working.  MEDICATIONS:  Occasional ibuprofen secondary to painful menses.  REVIEW OF SYSTEMS:  As noted above in the HPI.  PHYSICAL EXAMINATION:  VITAL SIGNS:  Temperature 98.1, heart rate 92, respirations 18 at 97% on room air, BP is 138/95. GENERAL:  The patient is awake, alert, no acute distress, appears slightly older than stated age. HEENT:  Head is atraumatic.  Extraocular muscles are intact.  Moist mucous membranes are noted. NECK:  Supple.  No lymphadenopathy.  No spinous process tenderness and no pain with motion. CHEST:  Clear, however, breath sounds are somewhat decreased at the bases.  No crackles or wheezes are appreciated. CARDIAC:  S1 and S2 are noted. ABDOMEN:  Soft, nontender with positive bowel sounds. PELVIS:  No instability or acute findings are noted.  No pain with palpation  of the bony structures. EXTREMITIES:  Bilateral upper extremities and right lower extremity are free of acute findings.  Motor and sensory functions are intact. Extremities are warm and palpable pulses are appreciated.  No wounds are noted.  No swelling is noted and the patient is able to demonstrate active motion in all joints.  No pain with palpation over bony structures as well.  Focused examination of the left lower extremity, hip is without acute findings.  A spanning external fixator is noted with 2 pins in the femur and 2 pins in the tibia.  There is less than one fingerbreadth distance between the clamps and the skin, particularly along the thigh sites.  No purulence is noted at pin sites, they look stable.  The patient does demonstrate a  significant left knee effusion. There is also significant swelling to the proximal left tibia, with minimal wrinkling of the skin noted.  I do not appreciate any significant fracture blisters at this time.  Tibial pin sites also are stable at the current time as well.  The patient demonstrates active movement with respect to EHL, FHL, anterior tibialis, posterior tibialis, peroneals and gastroc-soleus complex.  Deep peroneal nerve, superficial peroneal nerve, and tibial nerve sensory function are intact.  Extremity is warm.  Palpable dorsalis pedis pulse and posterior tibialis pulses are appreciated.  No deep calf tenderness is noted.  No pain with passive stretching of the anterolateral, superficial posterior, and deep posterior compartments.  LABORATORY DATA FROM ADMISSION:  Sodium 138, potassium 3.7, chloride 103, bicarb 28, BUN 9, creatinine 0.54, glucose 103.  Urine microscopy demonstrates a few bacteria, however, her UA demonstrates no nitrites or leukocytes.  Again, admission alcohol level was elevated at 119.  CBC; white blood cells 11.1, hemoglobin 15.1, hematocrit 42.4, platelets 185.  X-RAYS:  Most recent x-rays of the left knee demonstrates a severely comminuted bicondylar tibial plateau fracture with involvement of both medial and lateral joints.  Extension down into the proximal tibial shaft is noted as well.  I do not appreciate any fractures of the proximal femur.  There was also a CT scan done prior to application of the ex-fix which demonstrates a bicondylar plateau fracture with severe comminution, particularly along the posterior medial joint with extensive shortening noted on this prereduction CT scan and significant depression, particularly along the medial plateau as well as lateral plateau.  Again significant shortening is noted, but again this is prereduction CT scan.  ASSESSMENT AND PLAN:  A 40 year old Caucasian female status post ground- level fall with a  severely comminuted left bicondylar tibial plateau fracture with proximal shaft extension and suspected poor bone quality. 1. Left comminuted bicondylar tibial plateau fracture with shaft     extension, Schatzker 6 OTA classification, 41-C3.     a.     We will take Ms. Gahm back to the OR today for revision of      her external fixator and re-read and to help restore additional      length to her fracture.  After this had been achieved, we will      obtain a CT scan postreduction to facilitate with surgical      planning.  However, given her current fracture pattern      demonstrated on her prereduction CT scan, she will need dual      plating given the pretty large posteromedial fragment.  This may      need true posterior plating in addition to additional lateral  plating.  She will require a significant amount of bone graft as      well given the amount of depression demonstrated on prereduction      CT scan.  However, the patient's soft tissue demonstrates a      significant swelling and will likely be 7-10-day interval until      definitive surgical stabilization can be achieved.     b.     I do anticipate poor compliance and delayed healing given      the patient's social history, given her extensive nicotine use as      well as concomitant alcohol use.  I will check a metabolic bone      panel on the patient to evaluate for any deficiencies, however, I      have a suspect that her decreased bone mineral density is due to      her alcohol and nicotine abuse.     c.     The patient will be nonweightbearing during her time in the      external fixator and will also be nonweightbearing on her left      lower extremity for 8 weeks after definitive stabilization with      plate osteosynthesis.  The patient would be allowed unrestricted      range of motion after fixation of her fracture.  After definitive      fixation of her fracture, she will likely be placed in hand brace       to protect against excessive valgus and varus forces during      motion.  Depending on the response of her soft tissue over the      next several days, the patient may need to be sent out to home      with followup in a week or so to the office to evaluate her soft      tissue for delayed fixation.  I do doubt that a definitive      fixation will occur during this hospitalization. 2. Nicotine dependence.  The patient was started on a nicotine patch.     We will discontinue this nicotine patch as this impedes bone     healing as well as soft tissue healing. 3. Alcohol abuse.  The patient is currently on delirium tremens     prophylaxis appears to be stable.  Her blood pressure is slightly     elevated at this current time, however, she may have some     underlying hypertension which is yet to be diagnosed. 4. Pain.  Continue with current pain regimen which is Percocet 10/325     as well as Robaxin.  We will modify the use after surgery for     additional control.  After surgery, we will start the patient on     Lovenox for DVT prophylaxis 40 mg subcu daily until the time of     definitive fixation and we will continue after definitive fixation     for 2-3 weeks. 5. Diet.  The patient will remain n.p.o. for now.  We will resume     regular diet after ex-fix revision. 6. Activity.  Again, the patient is nonweightbearing on her left lower     extremity.  We will reconsult PT/OT after surgery today to help     facilitate mobilization and to determine if the patient can return     home or if she needs a short-term nursing home placement. 7. Disposition.  To the OR today for ex-fix revision.     Mearl Latin, PA   ______________________________ Doralee Albino. Carola Frost, M.D.    KWP/MEDQ  D:  05/16/2011  T:  05/16/2011  Job:  [id number]  Electronically Signed by Montez Morita PA on 05/22/2011 01:05:30 PM Electronically Signed by Myrene Galas M.D. on 06/20/2011 06:45:22 PM

## 2011-07-18 NOTE — Discharge Summary (Signed)
[name], [name]               ACCOUNT NO.:  [id number]  MEDICAL RECORD NO.:  [id number]  LOCATION:  5037                         FACILITY:  [facility]  PHYSICIAN:  Mearl Latin, PA       DATE OF BIRTH:  [birth year]  DATE OF ADMISSION:  06/01/2011 DATE OF DISCHARGE:  06/04/2011                              DISCHARGE SUMMARY   DISCHARGE DIAGNOSES: 1. Left Schatzker 6 tibial plateau fracture, status post open     reduction and internal fixation. 2. Nicotine dependence. 3. Preoperative urinary tract infection. 4. Anxiety.  PROCEDURES PERFORMED:  On June 01, 2011, removal of external fixator, left leg; ORIF, left tibial plateau fracture, left tibial shaft fracture, and tibial eminence fracture by Doralee Albino. Carola Frost, MD.  BRIEF HISTORY AND HOSPITAL COURSE:  [name] is a 40 year old female who reportedly jumped on to a porch onto her leg about 2 weeks prior to presentation to OR for definitive fixation.  After this initial injury, she was brought to [facility] for evaluation where her swelling was so severe to Deborah point that she needed to be temporized with an external fixator until her soft tissue swelling could resolve.  We followed her very closely in Deborah outpatient setting, monitoring her soft tissue.  She was finally deemed stable for surgery on June 01, 2011, and [name] underwent procedure described up above and tolerated this very well.  After surgery, [name] was transferred to Deborah PACU for recovery from anesthesia and then was transported to Deborah Orthopedic floor for continued observation and pain control.  On postoperative day #1, [name] was reporting pretty moderate pain in her left leg, but was fairly comfortable.  She noticed some burning and throbbing pain, but otherwise stable.  Vital signs were unremarkable and her labs were stable.  On clinical exam, [name] was stable.  Her left lower extremity demonstrated intact motor and  sensory function.  No pain with passive stretching.  Wounds were stable as well.  [name] began to work with Physical Therapy on postoperative day #1 as well and was doing fairly well with them.  [name] was also noted to have a UTI on her preoperative labs and we treated her with IV Rocephin for 48 hours.  [name] continued to progress well with physical therapy over Deborah next 2 days and it was ultimately deemed stable for discharge home on postoperative day #3.  Clinical encounter note for postoperative day #3.  Subjective and objective:  [name] reports her pain 2/3, was tolerating her diet.  No calf pain.  No numbness or tingling.  No nausea or vomiting.  PHYSICAL EXAMINATION:  VITAL SIGNS:  Temperature 98.7, heart rate 85, respirations 18, 98% on room air, BP is 102/72.  INR 1.73. GENERAL:  [name] is awake, alert, in no acute distress. EXTREMITIES:  Wound is clean, dry, and intact.  No drainage is noted. Compartments are soft, nontender.  2+ dorsalis pedis pulses noted. Motor and sensory function are intact distally. LUNGS:  Clear. CARDIAC:  S1 and S2 are noted.  ASSESSMENT AND PLAN:  A 40 year old female status post open reduction and internal fixation complex left  tibial plateau fracture.  1. Left tibial plateau fracture, Schatzker 6.  Nonweightbearing x8     weeks.  Ice and elevate as needed.  No pillows under Deborah knee.     This was reviewed extensively with [name] and family as well     as included in her discharge instructions.  [name] is to be in     a hinged-knee brace.  May begin some gentle range of motion with     flexion and extension as well as quad sets, straight leg raises,     short arc quads, ankle exercises, and heel cord stretches. 2. Nicotine dependence.  Again, reviewed extensively Deborah importance of     smoking cessation and Deborah negative effects on soft tissue and bone     healing. 3. Urinary tract infection is resolved, treated  with Rocephin. 4. Anxiety.  Ativan 1 mg p.o. q.8 hours p.r.n. 5. Pain.  Neurontin, OxyIR, and Percocet as needed for pain. 6. Deep vein thrombosis/pulmonary embolism prophylaxis.  Lovenox to     Coumadin bridge.  DISCHARGE MEDICATIONS: 1. Coumadin 5 mg as directed per pharmacy protocol. 2. Colace 100 mg p.o. b.i.d. 3. Lovenox 40 mg subcutaneous injection daily for Deborah next 4 days     until INR is therapeutic. 4. Ferrous sulfate 325 mg 1 p.o. t.i.d. with meals. 5. Gabapentin 300 mg 1 p.o. t.i.d. 6. Oxycodone 10 mg 1-2 p.o. q.3 hours as needed for breakthrough pain. 7. Percocet 5/325 1-2 p.o. q.6 hours as needed for pain. 8. Calcium citrate 600 mg 1 p.o. daily. 9. Multivitamin 1 p.o. daily. 10.Vitamin C 500 mg 1 p.o. daily. 11.Vitamin D3 1000 units 1 p.o. b.i.d.  PERTINENT LABS:  At Deborah time of discharge include Deborah following.  INR of 1.73.  Hemoglobin 9.1, hematocrit 27.8, platelets 304.  Sodium 139, potassium 4.0, chloride 104, bicarb 30, BUN 5, creatinine 0.47, glucose 105.  Urine culture demonstrated no growth, but again UA demonstrates small leukocytes.  DISCHARGE INSTRUCTIONS AND PLANS:  [name] has sustained a very severe injury to her left lower extremity.  We were able to achieve excellent fixation, restore alignment, stability, repair of meniscus, and improve Deborah joint surface anatomy.  However, [name] is at an increased risk for Deborah development of posttraumatic arthritis.  [name] will be nonweightbearing for Deborah next 8 weeks, but has unrestricted range of motion of her left knee and ankle within her hinged brace.  [name] to work diligently with Home Health Physical Therapy, and we will transition her to Outpatient Physical Therapy ASAP. [name] again should refrain from smoking and using nicotine products.  We reviewed this with [name] at length but at times, has been fairly resistant and not to Deborah idea she was noted, requested nicotine  patches on several occasions and even requested a pass to outside to smoke.  I am concerned that she will continue to be noncompliant and will delay her ability to heal.  [name] will be on Coumadin for Deborah next 8 weeks or so at least until she is mobilizing well enough to further decrease her risk of development of clot.  We will check [name] back in 2 weeks or so for reevaluation followup x- rays and removal of her sutures.  She is encouraged to be as mobile as possible, and recommended against constant sedentary activity.  [name] should contact Deborah office if she has had any questions.  We reviewed wound care with [name].  She can clean her wound with soap and water once this been dry for 36-48 hours.  She is to avoid placing any lotions, ointments, or solutions directly over Deborah wounds as these may cause wound dehiscence.  I did include these wound care instructions with [name] for her discharge paperwork.     Mearl Latin, PA     KWP/MEDQ  D:  07/13/2011  T:  07/13/2011  Job:  [id number]  Electronically Signed by Montez Morita PA on 07/14/2011 10:12:24 AM Electronically Signed by Myrene Galas M.D. on 07/18/2011 01:46:07 PM

## 2011-08-09 ENCOUNTER — Ambulatory Visit (HOSPITAL_COMMUNITY): Admission: RE | Admit: 2011-08-09 | Payer: No Typology Code available for payment source | Source: Ambulatory Visit

## 2011-09-21 ENCOUNTER — Encounter (HOSPITAL_COMMUNITY): Payer: Self-pay | Admitting: Pharmacy Technician

## 2011-09-21 ENCOUNTER — Other Ambulatory Visit (HOSPITAL_COMMUNITY): Payer: No Typology Code available for payment source

## 2011-09-21 ENCOUNTER — Ambulatory Visit (HOSPITAL_COMMUNITY)
Admission: RE | Admit: 2011-09-21 | Discharge: 2011-09-21 | Disposition: A | Discharge status: Home / Self Care | Payer: Self-pay | Source: Ambulatory Visit | Attending: Orthopedic Surgery | Admitting: Orthopedic Surgery

## 2011-09-21 ENCOUNTER — Other Ambulatory Visit (HOSPITAL_COMMUNITY): Payer: Self-pay | Admitting: Orthopedic Surgery

## 2011-09-21 DIAGNOSIS — B999 Unspecified infectious disease: Secondary | ICD-10-CM

## 2011-09-21 DIAGNOSIS — M25569 Pain in unspecified knee: Secondary | ICD-10-CM

## 2011-09-21 DIAGNOSIS — M899 Disorder of bone, unspecified: Secondary | ICD-10-CM | POA: Insufficient documentation

## 2011-09-21 DIAGNOSIS — R609 Edema, unspecified: Secondary | ICD-10-CM

## 2011-09-21 LAB — DIFFERENTIAL
Basophils Absolute: 0 10*3/uL (ref 0.0–0.1)
Eosinophils Relative: 1 % (ref 0–5)
Lymphocytes Relative: 27 % (ref 12–46)
Lymphs Abs: 3 10*3/uL (ref 0.7–4.0)
Neutro Abs: 7.2 10*3/uL (ref 1.7–7.7)

## 2011-09-21 LAB — C-REACTIVE PROTEIN: CRP: 0.45 mg/dL — ABNORMAL LOW (ref <0.60)

## 2011-09-21 LAB — CBC
HCT: 48 % — ABNORMAL HIGH (ref 36.0–46.0)
MCHC: 35 g/dL (ref 30.0–36.0)
RDW: 14.5 % (ref 11.5–15.5)

## 2011-09-21 LAB — SEDIMENTATION RATE: Sed Rate: 1 mm/hr (ref 0–22)

## 2011-09-25 ENCOUNTER — Encounter (HOSPITAL_COMMUNITY)
Admission: RE | Admit: 2011-09-25 | Discharge: 2011-09-25 | Disposition: A | Discharge status: Home / Self Care | Payer: Self-pay | Source: Ambulatory Visit | Attending: Orthopedic Surgery | Admitting: Orthopedic Surgery

## 2011-09-25 ENCOUNTER — Encounter (HOSPITAL_COMMUNITY): Payer: Self-pay

## 2011-09-25 LAB — COMPREHENSIVE METABOLIC PANEL
ALT: 15 U/L (ref 0–35)
Alkaline Phosphatase: 92 U/L (ref 39–117)
CO2: 26 mEq/L (ref 19–32)
Calcium: 9.2 mg/dL (ref 8.4–10.5)
Chloride: 101 mEq/L (ref 96–112)
GFR calc Af Amer: >90 mL/min (ref >90)
GFR calc non Af Amer: >90 mL/min (ref >90)
Glucose, Bld: 104 mg/dL — ABNORMAL HIGH (ref 70–99)
Potassium: 4 mEq/L (ref 3.5–5.1)
Sodium: 137 mEq/L (ref 135–145)
Total Bilirubin: 0.3 mg/dL (ref 0.3–1.2)

## 2011-09-25 LAB — HCG, SERUM, QUALITATIVE: Preg, Serum: NEGATIVE

## 2011-09-25 LAB — URINE CULTURE
Culture  Setup Time: 201301141025
Culture: NO GROWTH

## 2011-09-25 LAB — CBC
Hemoglobin: 17.6 g/dL — ABNORMAL HIGH (ref 12.0–15.0)
MCH: 32.7 pg (ref 26.0–34.0)
RBC: 5.39 MIL/uL — ABNORMAL HIGH (ref 3.87–5.11)
WBC: 9.7 10*3/uL (ref 4.0–10.5)

## 2011-09-25 LAB — DIFFERENTIAL
Basophils Absolute: 0 10*3/uL (ref 0.0–0.1)
Basophils Relative: 0 % (ref 0–1)
Eosinophils Absolute: 0.1 10*3/uL (ref 0.0–0.7)
Eosinophils Relative: 1 % (ref 0–5)

## 2011-09-25 LAB — URINALYSIS, ROUTINE W REFLEX MICROSCOPIC
Bilirubin Urine: NEGATIVE
Specific Gravity, Urine: 1.019 (ref 1.005–1.030)
pH: 5.5 (ref 5.0–8.0)

## 2011-09-25 LAB — URINE MICROSCOPIC-ADD ON

## 2011-09-25 LAB — PROTIME-INR: Prothrombin Time: 12.4 seconds (ref 11.6–15.2)

## 2011-09-25 MED ORDER — CHLORHEXIDINE GLUCONATE 4 % EX LIQD: 60.0000 mL | Freq: Once | CUTANEOUS | Status: DC

## 2011-09-25 MED ORDER — VANCOMYCIN HCL IN DEXTROSE 1-5 GM/200ML-% IV SOLN
1000.0000 mg | INTRAVENOUS | Status: AC | PRN
  Administered 2011-09-26: 1000 mg via INTRAVENOUS
  Filled 2011-09-25: qty 200

## 2011-09-25 NOTE — H&P (Signed)
Orthopaedic Trauma Service    Chief Complaint: Wound L leg and L leg pain HPI: [name] is an 41 y.o. Female s/p ORIF complex L bicondylar tibial plateau fracture on 06/01/2011.  Pt has had issues throughout with slow wound healing, persistent leg swelling and heavy nicotine use, which have complicated her clinical course thus far.  Pt has also had tremendous difficulty in terms of achieving adequate ROM, as well as persistent extensor lag.  Pt has recently, over the last 4 weeks, had medial wound issues.  An in office I&D was performed on 08/30/2011 to debride down to healthy tissue.  Pt was instructed and shown how to perform wet to dry dressings.  Pt has been very slow to respond and the wound appears to have gotten worse.  Pt has also had persistent L knee pain.  As such a CT scan was obtained to quantitatively evaluated the degree of healing present.  CT appear to show areas of union and nonunion.  [name], repeat I&D of medial wound +/- VAC.   Past Medical History  Diagnosis Date  . Asthma     Past Surgical History  Procedure Date  . Fracture surgery     8/12,9/12  . Foot fracture surgery     pins due to "too much movement"    No family history on file. Social History:  reports that she has been smoking.  She does not have any smokeless tobacco history on file. She reports that she drinks alcohol. She reports that she does not use illicit drugs. Extensive nicotine use  Allergies:  Allergies  Allergen Reactions  . Codeine Itching      Results for orders placed during the hospital encounter of 09/25/11 (from the past 48 hour(s))  TYPE AND SCREEN     Status: Normal   Collection Time   09/25/11  8:25 AM      Component Value Range Comment   ABO/RH(D) A POS      Antibody Screen NEG      Sample Expiration 09/28/2011     APTT     Status: Normal   Collection Time   09/25/11  8:39 AM      Component Value Range Comment   aPTT 26  24 - 37 (seconds)   CBC      Status: Abnormal   Collection Time   09/25/11  8:39 AM      Component Value Range Comment   WBC 9.7  4.0 - 10.5 (K/uL)    RBC 5.39 (*) 3.87 - 5.11 (MIL/uL)    Hemoglobin 17.6 (*) 12.0 - 15.0 (g/dL)    HCT 45.4 (*) 09.8 - 46.0 (%)    MCV 92.9  78.0 - 100.0 (fL)    MCH 32.7  26.0 - 34.0 (pg)    MCHC 35.1  30.0 - 36.0 (g/dL)    RDW 11.9  14.7 - 82.9 (%)    Platelets 224  150 - 400 (K/uL)   COMPREHENSIVE METABOLIC PANEL     Status: Abnormal   Collection Time   09/25/11  8:39 AM      Component Value Range Comment   Sodium 137  135 - 145 (mEq/L)    Potassium 4.0  3.5 - 5.1 (mEq/L)    Chloride 101  96 - 112 (mEq/L)    CO2 26  19 - 32 (mEq/L)    Glucose, Bld 104 (*) 70 - 99 (mg/dL)    BUN 9  6 - 23 (  mg/dL)    Creatinine, Ser 0.45  0.50 - 1.10 (mg/dL)    Calcium 9.2  8.4 - 10.5 (mg/dL)    Total Protein 7.4  6.0 - 8.3 (g/dL)    Albumin 3.7  3.5 - 5.2 (g/dL)    AST 17  0 - 37 (U/L)    ALT 15  0 - 35 (U/L)    Alkaline Phosphatase 92  39 - 117 (U/L)    Total Bilirubin 0.3  0.3 - 1.2 (mg/dL)    GFR calc non Af Amer >90  >90 (mL/min)    GFR calc Af Amer >90  >90 (mL/min)   DIFFERENTIAL     Status: Normal   Collection Time   09/25/11  8:39 AM      Component Value Range Comment   Neutrophils Relative 62  43 - 77 (%)    Neutro Abs 6.0  1.7 - 7.7 (K/uL)    Lymphocytes Relative 30  12 - 46 (%)    Lymphs Abs 2.9  0.7 - 4.0 (K/uL)    Monocytes Relative 7  3 - 12 (%)    Monocytes Absolute 0.7  0.1 - 1.0 (K/uL)    Eosinophils Relative 1  0 - 5 (%)    Eosinophils Absolute 0.1  0.0 - 0.7 (K/uL)    Basophils Relative 0  0 - 1 (%)    Basophils Absolute 0.0  0.0 - 0.1 (K/uL)   PROTIME-INR     Status: Normal   Collection Time   09/25/11  8:39 AM      Component Value Range Comment   Prothrombin Time 12.4  11.6 - 15.2 (seconds)    INR 0.91  0.00 - 1.49    SURGICAL PCR SCREEN     Status: Normal   Collection Time   09/25/11  8:39 AM      Component Value Range Comment   MRSA, PCR NEGATIVE  NEGATIVE      Staphylococcus aureus NEGATIVE  NEGATIVE    HCG, SERUM, QUALITATIVE     Status: Normal   Collection Time   09/25/11  8:39 AM      Component Value Range Comment   Preg, Serum NEGATIVE  NEGATIVE    URINALYSIS, ROUTINE W REFLEX MICROSCOPIC     Status: Abnormal   Collection Time   09/25/11  8:40 AM      Component Value Range Comment   Color, Urine YELLOW  YELLOW     APPearance CLOUDY (*) CLEAR     Specific Gravity, Urine 1.019  1.005 - 1.030     pH 5.5  5.0 - 8.0     Glucose, UA NEGATIVE  NEGATIVE (mg/dL)    Hgb urine dipstick TRACE (*) NEGATIVE     Bilirubin Urine NEGATIVE  NEGATIVE     Ketones, ur NEGATIVE  NEGATIVE (mg/dL)    Protein, ur NEGATIVE  NEGATIVE (mg/dL)    Urobilinogen, UA 0.2  0.0 - 1.0 (mg/dL)    Nitrite NEGATIVE  NEGATIVE     Leukocytes, UA TRACE (*) NEGATIVE    URINE MICROSCOPIC-ADD ON     Status: Abnormal   Collection Time   09/25/11  8:40 AM      Component Value Range Comment   Squamous Epithelial / LPF FEW (*) RARE     WBC, UA 3-6  <3 (WBC/hpf)    RBC / HPF 0-2  <3 (RBC/hpf)    Bacteria, UA RARE  RARE      Chest 2 View  09/25/2011  *  RADIOLOGY REPORT*  Clinical Data: Preop  CHEST - 2 VIEW  Comparison: 05/13/2011  Findings: Cardiomediastinal silhouette is stable.  Mild thoracic dextroscoliosis again noted.  No acute infiltrate or pleural effusion.  No pulmonary edema.  IMPRESSION: No active disease.  No significant change.  Original Report Authenticated By: [name], M.D.    ROS  L leg pain  No fevers  No chills  Physical Exam  Office vitals are stable  Gen: NAD, does not appear septic Lungs: Clear Cardiac: S1 and S2 Abd: + BS Ext: L lower extremity  Medial wound proximal L lower leg noted, no foul odor noted, some purulent tissue noted  + ttp medial L lower leg  Knee ROM limited  Distal motor and sensory functions intact  Moderate swelling noted  Ext warm  + DP pulse  No DCT  Compartments soft and NT  CT L knee  Areas of nonunion noted  proximal L tibia  Areas of consolidation also present  ESR and CRP were within normal ranges   Assessment/Plan  41 y/o female s/p ORIF L tibial plateau with partial nonunion and chronic medial wound  OR for ROH L proximal tibia.  Will likely not place any new hardware.  Possible that we may place pt in Ilizarov fixator if too much motion is noted after [facility] Evaluate medial wound intra-operatively.   Due do swelling, unlikely to close medial wound.  Pt may need wound VAC and if wound persists possibly STSG.  Continue to monitor closely Chronic nicotine dependence Admit for pain control and observation.   Will determine what, if any abxs are needed long term Pt will be given vanc in OR suite after cxs obtained, orders were clearly written to reflect this  Mearl Latin, PA-C 09/25/2011, 11:19 PM

## 2011-09-25 NOTE — Pre-Procedure Instructions (Addendum)
20 [name]  09/25/2011   Your procedure is scheduled on:  09/26/11  Report to [facility] at 800 AM.  Call this number if you have problems the morning of surgery: [phone]   Remember:   Do not eat food:After Midnight.  May have clear liquids: up to 4 Hours before arrival.  Clear liquids include soda, tea, black coffee, apple or grape juice, broth.  Take these medicines the morning of surgery with A SIP OF WATER: oxycodone STOP ibuprofen   Do not wear jewelry, make-up or nail polish.  Do not wear lotions, powders, or perfumes. You may wear deodorant.  Do not shave 48 hours prior to surgery.  Do not bring valuables to the hospital.  Contacts, dentures or bridgework may not be worn into surgery.  Leave suitcase in the car. After surgery it may be brought to your room.  For patients admitted to the hospital, checkout time is 11:00 AM the day of discharge.   Patients discharged the day of surgery will not be allowed to drive home.  Name and phone number of your driver: edward father [phone]  Special Instructions: Incentive Spirometry - Practice and bring it with you on the day of surgery. and CHG Shower Use Special Wash: 1/2 bottle night before surgery and 1/2 bottle morning of surgery.   Please read over the following fact sheets that you were given: Pain Booklet, Coughing and Deep Breathing, Blood Transfusion Information, MRSA Information and Surgical Site Infection Prevention

## 2011-09-26 ENCOUNTER — Inpatient Hospital Stay (HOSPITAL_COMMUNITY)
Admission: RE | Admit: 2011-09-26 | Discharge: 2011-09-29 | DRG: 496 | Disposition: A | Discharge status: Home Health | Payer: Self-pay | Source: Ambulatory Visit | Attending: Orthopedic Surgery | Admitting: Orthopedic Surgery

## 2011-09-26 ENCOUNTER — Encounter (HOSPITAL_COMMUNITY): Payer: Self-pay | Admitting: Certified Registered Nurse Anesthetist

## 2011-09-26 ENCOUNTER — Ambulatory Visit (HOSPITAL_COMMUNITY): Payer: Self-pay

## 2011-09-26 ENCOUNTER — Encounter (HOSPITAL_COMMUNITY)
Admission: RE | Disposition: A | Discharge status: Home Health | Payer: Self-pay | Source: Ambulatory Visit | Attending: Orthopedic Surgery

## 2011-09-26 ENCOUNTER — Ambulatory Visit (HOSPITAL_COMMUNITY): Payer: Self-pay | Admitting: Certified Registered Nurse Anesthetist

## 2011-09-26 DIAGNOSIS — Z01818 Encounter for other preprocedural examination: Secondary | ICD-10-CM

## 2011-09-26 DIAGNOSIS — B373 Candidiasis of vulva and vagina: Secondary | ICD-10-CM | POA: Diagnosis present

## 2011-09-26 DIAGNOSIS — S82143A Displaced bicondylar fracture of unspecified tibia, initial encounter for closed fracture: Secondary | ICD-10-CM

## 2011-09-26 DIAGNOSIS — B3731 Acute candidiasis of vulva and vagina: Secondary | ICD-10-CM | POA: Diagnosis present

## 2011-09-26 DIAGNOSIS — F17200 Nicotine dependence, unspecified, uncomplicated: Secondary | ICD-10-CM | POA: Diagnosis present

## 2011-09-26 DIAGNOSIS — M869 Osteomyelitis, unspecified: Secondary | ICD-10-CM

## 2011-09-26 DIAGNOSIS — J45909 Unspecified asthma, uncomplicated: Secondary | ICD-10-CM | POA: Diagnosis present

## 2011-09-26 DIAGNOSIS — Z01812 Encounter for preprocedural laboratory examination: Secondary | ICD-10-CM

## 2011-09-26 DIAGNOSIS — Y838 Other surgical procedures as the cause of abnormal reaction of the patient, or of later complication, without mention of misadventure at the time of the procedure: Secondary | ICD-10-CM | POA: Diagnosis present

## 2011-09-26 DIAGNOSIS — T847XXA Infection and inflammatory reaction due to other internal orthopedic prosthetic devices, implants and grafts, initial encounter: Principal | ICD-10-CM | POA: Diagnosis present

## 2011-09-26 DIAGNOSIS — E871 Hypo-osmolality and hyponatremia: Secondary | ICD-10-CM | POA: Diagnosis not present

## 2011-09-26 DIAGNOSIS — Z01810 Encounter for preprocedural cardiovascular examination: Secondary | ICD-10-CM

## 2011-09-26 DIAGNOSIS — M24669 Ankylosis, unspecified knee: Secondary | ICD-10-CM | POA: Diagnosis present

## 2011-09-26 HISTORY — PX: EXTERNAL FIXATION LEG: SHX1549

## 2011-09-26 LAB — GRAM STAIN

## 2011-09-26 LAB — ANAEROBIC CULTURE

## 2011-09-26 LAB — WOUND CULTURE

## 2011-09-26 SURGERY — EXTERNAL FIXATION, LOWER EXTREMITY
Anesthesia: General | Site: Knee | Laterality: Left | Wound class: Dirty or Infected

## 2011-09-26 MED ORDER — DOCUSATE SODIUM 100 MG PO CAPS
100.0000 mg | ORAL_CAPSULE | Freq: Two times a day (BID) | ORAL | Status: DC
  Administered 2011-09-26 – 2011-09-29 (×6): 100 mg via ORAL
  Filled 2011-09-26 (×7): qty 1

## 2011-09-26 MED ORDER — POTASSIUM CHLORIDE IN NACL 20-0.9 MEQ/L-% IV SOLN
INTRAVENOUS | Status: DC
  Administered 2011-09-26 – 2011-09-27 (×2): via INTRAVENOUS
  Filled 2011-09-26 (×5): qty 1000

## 2011-09-26 MED ORDER — ONDANSETRON HCL 4 MG PO TABS: 4.0000 mg | ORAL_TABLET | Freq: Four times a day (QID) | ORAL | Status: DC | PRN

## 2011-09-26 MED ORDER — GLYCOPYRROLATE 0.2 MG/ML IJ SOLN
INTRAMUSCULAR | Status: DC | PRN
  Administered 2011-09-26: .4 mg via INTRAVENOUS

## 2011-09-26 MED ORDER — MAGNESIUM HYDROXIDE 400 MG/5ML PO SUSP: 30.0000 mL | Freq: Every day | ORAL | Status: DC | PRN

## 2011-09-26 MED ORDER — METHOCARBAMOL 100 MG/ML IJ SOLN
500.0000 mg | Freq: Four times a day (QID) | INTRAVENOUS | Status: DC | PRN
  Filled 2011-09-26: qty 5

## 2011-09-26 MED ORDER — FENTANYL CITRATE 0.05 MG/ML IJ SOLN
INTRAMUSCULAR | Status: DC | PRN
  Administered 2011-09-26: 100 ug via INTRAVENOUS
  Administered 2011-09-26 (×7): 50 ug via INTRAVENOUS
  Administered 2011-09-26: 150 ug via INTRAVENOUS
  Administered 2011-09-26: 100 ug via INTRAVENOUS
  Administered 2011-09-26: 50 ug via INTRAVENOUS

## 2011-09-26 MED ORDER — LACTATED RINGERS IV SOLN
INTRAVENOUS | Status: DC | PRN
  Administered 2011-09-26 (×2): via INTRAVENOUS

## 2011-09-26 MED ORDER — METOCLOPRAMIDE HCL 10 MG PO TABS: 5.0000 mg | ORAL_TABLET | Freq: Three times a day (TID) | ORAL | Status: DC | PRN

## 2011-09-26 MED ORDER — METHOCARBAMOL 500 MG PO TABS
500.0000 mg | ORAL_TABLET | Freq: Four times a day (QID) | ORAL | Status: DC | PRN
  Administered 2011-09-27 – 2011-09-29 (×5): 500 mg via ORAL
  Filled 2011-09-26 (×5): qty 1

## 2011-09-26 MED ORDER — LACTATED RINGERS IV SOLN
INTRAVENOUS | Status: DC
  Administered 2011-09-26: 10:00:00 via INTRAVENOUS

## 2011-09-26 MED ORDER — VANCOMYCIN HCL 1000 MG IV SOLR
750.0000 mg | Freq: Three times a day (TID) | INTRAVENOUS | Status: DC
  Administered 2011-09-26 – 2011-09-29 (×9): 750 mg via INTRAVENOUS
  Filled 2011-09-26 (×10): qty 750

## 2011-09-26 MED ORDER — ONDANSETRON HCL 4 MG/2ML IJ SOLN
INTRAMUSCULAR | Status: DC | PRN
  Administered 2011-09-26: 4 mg via INTRAVENOUS

## 2011-09-26 MED ORDER — CALCIUM CARBONATE-VITAMIN D 500-200 MG-UNIT PO TABS
1.0000 | ORAL_TABLET | Freq: Every day | ORAL | Status: DC
  Administered 2011-09-27 – 2011-09-29 (×3): 1 via ORAL
  Filled 2011-09-26 (×3): qty 1

## 2011-09-26 MED ORDER — HYDROXYZINE HCL 25 MG PO TABS
25.0000 mg | ORAL_TABLET | Freq: Three times a day (TID) | ORAL | Status: DC | PRN
  Filled 2011-09-26: qty 1

## 2011-09-26 MED ORDER — HYDROMORPHONE HCL PF 1 MG/ML IJ SOLN: 0.5000 mg | INTRAMUSCULAR | Status: DC | PRN

## 2011-09-26 MED ORDER — ONDANSETRON HCL 4 MG/2ML IJ SOLN: 4.0000 mg | Freq: Four times a day (QID) | INTRAMUSCULAR | Status: DC | PRN

## 2011-09-26 MED ORDER — PROMETHAZINE HCL 25 MG/ML IJ SOLN: 6.2500 mg | INTRAMUSCULAR | Status: DC | PRN

## 2011-09-26 MED ORDER — MIDAZOLAM HCL 5 MG/5ML IJ SOLN
INTRAMUSCULAR | Status: DC | PRN
  Administered 2011-09-26: 2 mg via INTRAVENOUS

## 2011-09-26 MED ORDER — OXYCODONE-ACETAMINOPHEN 5-325 MG PO TABS
1.0000 | ORAL_TABLET | ORAL | Status: DC | PRN
Start: 2011-09-26 — End: 2011-09-29
  Administered 2011-09-26 – 2011-09-29 (×14): 2 via ORAL
  Filled 2011-09-26 (×14): qty 2

## 2011-09-26 MED ORDER — HYDROMORPHONE HCL PF 1 MG/ML IJ SOLN
INTRAMUSCULAR | Status: AC
  Filled 2011-09-26: qty 1

## 2011-09-26 MED ORDER — CALCIUM CARBONATE-VITAMIN D 600-400 MG-UNIT PO TABS: 1.0000 | ORAL_TABLET | Freq: Every day | ORAL | Status: DC

## 2011-09-26 MED ORDER — PROPOFOL 10 MG/ML IV EMUL
INTRAVENOUS | Status: DC | PRN
  Administered 2011-09-26: 200 mg via INTRAVENOUS

## 2011-09-26 MED ORDER — METHOCARBAMOL 100 MG/ML IJ SOLN
500.0000 mg | INTRAMUSCULAR | Status: AC
  Administered 2011-09-26: 500 mg via INTRAVENOUS
  Filled 2011-09-26: qty 5

## 2011-09-26 MED ORDER — NEOSTIGMINE METHYLSULFATE 1 MG/ML IJ SOLN
INTRAMUSCULAR | Status: DC | PRN
  Administered 2011-09-26: 3 mg via INTRAVENOUS

## 2011-09-26 MED ORDER — ROCURONIUM BROMIDE 100 MG/10ML IV SOLN
INTRAVENOUS | Status: DC | PRN
  Administered 2011-09-26: 50 mg via INTRAVENOUS

## 2011-09-26 MED ORDER — DIPHENHYDRAMINE HCL 25 MG PO CAPS
25.0000 mg | ORAL_CAPSULE | Freq: Four times a day (QID) | ORAL | Status: DC | PRN
  Administered 2011-09-26: 25 mg via ORAL
  Filled 2011-09-26: qty 1

## 2011-09-26 MED ORDER — RIFAMPIN 300 MG PO CAPS
300.0000 mg | ORAL_CAPSULE | Freq: Two times a day (BID) | ORAL | Status: DC
  Administered 2011-09-26 – 2011-09-29 (×7): 300 mg via ORAL
  Filled 2011-09-26 (×8): qty 1

## 2011-09-26 MED ORDER — MEPERIDINE HCL 25 MG/ML IJ SOLN: 6.2500 mg | INTRAMUSCULAR | Status: DC | PRN

## 2011-09-26 MED ORDER — HYDROMORPHONE HCL PF 1 MG/ML IJ SOLN
0.2500 mg | INTRAMUSCULAR | Status: DC | PRN
  Administered 2011-09-26 (×2): 0.5 mg via INTRAVENOUS

## 2011-09-26 MED ORDER — MAGNESIUM CITRATE PO SOLN
1.0000 | Freq: Once | ORAL | Status: AC | PRN
  Filled 2011-09-26: qty 296

## 2011-09-26 MED ORDER — METOCLOPRAMIDE HCL 5 MG/ML IJ SOLN
5.0000 mg | Freq: Three times a day (TID) | INTRAMUSCULAR | Status: DC | PRN
  Filled 2011-09-26: qty 2

## 2011-09-26 MED ORDER — ENOXAPARIN SODIUM 40 MG/0.4ML ~~LOC~~ SOLN
40.0000 mg | SUBCUTANEOUS | Status: DC
  Administered 2011-09-27 – 2011-09-29 (×3): 40 mg via SUBCUTANEOUS
  Filled 2011-09-26 (×4): qty 0.4

## 2011-09-26 MED ORDER — OXYCODONE HCL 5 MG PO TABS
5.0000 mg | ORAL_TABLET | ORAL | Status: DC | PRN
  Administered 2011-09-26 – 2011-09-28 (×9): 10 mg via ORAL
  Filled 2011-09-26 (×9): qty 2

## 2011-09-26 MED ORDER — ALBUTEROL SULFATE HFA 108 (90 BASE) MCG/ACT IN AERS
2.0000 | INHALATION_SPRAY | Freq: Four times a day (QID) | RESPIRATORY_TRACT | Status: DC | PRN
  Filled 2011-09-26: qty 6.7

## 2011-09-26 MED ORDER — BISACODYL 10 MG RE SUPP: 10.0000 mg | Freq: Every day | RECTAL | Status: DC | PRN

## 2011-09-26 MED ORDER — SODIUM CHLORIDE 0.9 % IR SOLN
Status: DC | PRN
  Administered 2011-09-26: 1000 mL

## 2011-09-26 SURGICAL SUPPLY — 63 items
500ML CANISTER ×2 IMPLANT
BANDAGE ELASTIC 4 VELCRO ST LF (GAUZE/BANDAGES/DRESSINGS) ×2 IMPLANT
BANDAGE ELASTIC 6 VELCRO ST LF (GAUZE/BANDAGES/DRESSINGS) ×2 IMPLANT
BANDAGE ESMARK 6X9 LF (GAUZE/BANDAGES/DRESSINGS) ×1 IMPLANT
BANDAGE GAUZE ELAST BULKY 4 IN (GAUZE/BANDAGES/DRESSINGS) ×2 IMPLANT
BNDG CMPR 9X6 STRL LF SNTH (GAUZE/BANDAGES/DRESSINGS) ×1
BNDG COHESIVE 6X5 TAN STRL LF (GAUZE/BANDAGES/DRESSINGS) ×2 IMPLANT
BNDG ESMARK 6X9 LF (GAUZE/BANDAGES/DRESSINGS) ×2
BRUSH SCRUB DISP (MISCELLANEOUS) ×4 IMPLANT
CANISTER WOUND CARE 500ML ATS (WOUND CARE) ×2 IMPLANT
CLEANER TIP ELECTROSURG 2X2 (MISCELLANEOUS) ×1 IMPLANT
CLOTH BEACON ORANGE TIMEOUT ST (SAFETY) ×2 IMPLANT
COVER SURGICAL LIGHT HANDLE (MISCELLANEOUS) ×4 IMPLANT
CUFF TOURNIQUET SINGLE 18IN (TOURNIQUET CUFF) IMPLANT
CUFF TOURNIQUET SINGLE 24IN (TOURNIQUET CUFF) IMPLANT
CUFF TOURNIQUET SINGLE 34IN LL (TOURNIQUET CUFF) ×1 IMPLANT
DRAPE C-ARM 42X72 X-RAY (DRAPES) ×1 IMPLANT
DRAPE C-ARMOR (DRAPES) ×2 IMPLANT
DRAPE U-SHAPE 47X51 STRL (DRAPES) ×2 IMPLANT
DRSG ADAPTIC 3X8 NADH LF (GAUZE/BANDAGES/DRESSINGS) ×1 IMPLANT
DRSG MEPITEL 4X7.2 (GAUZE/BANDAGES/DRESSINGS) ×1 IMPLANT
DRSG VAC ATS SM SENSATRAC (GAUZE/BANDAGES/DRESSINGS) ×1 IMPLANT
ELECT REM PT RETURN 9FT ADLT (ELECTROSURGICAL) ×2
ELECTRODE REM PT RTRN 9FT ADLT (ELECTROSURGICAL) ×1 IMPLANT
EVACUATOR 1/8 PVC DRAIN (DRAIN) IMPLANT
GLOVE BIO SURGEON STRL SZ7.5 (GLOVE) ×2 IMPLANT
GLOVE BIO SURGEON STRL SZ8 (GLOVE) ×2 IMPLANT
GLOVE BIOGEL PI IND STRL 7.5 (GLOVE) ×1 IMPLANT
GLOVE BIOGEL PI IND STRL 8 (GLOVE) ×1 IMPLANT
GLOVE BIOGEL PI INDICATOR 7.5 (GLOVE) ×1
GLOVE BIOGEL PI INDICATOR 8 (GLOVE) ×1
GOWN PREVENTION PLUS XLARGE (GOWN DISPOSABLE) ×2 IMPLANT
GOWN STRL NON-REIN LRG LVL3 (GOWN DISPOSABLE) ×4 IMPLANT
HANDPIECE INTERPULSE COAX TIP (DISPOSABLE)
KIT BASIN OR (CUSTOM PROCEDURE TRAY) ×2 IMPLANT
KIT ROOM TURNOVER OR (KITS) ×2 IMPLANT
MANIFOLD NEPTUNE II (INSTRUMENTS) ×2 IMPLANT
NEEDLE 22X1 1/2 (OR ONLY) (NEEDLE) IMPLANT
NS IRRIG 1000ML POUR BTL (IV SOLUTION) ×2 IMPLANT
PACK ORTHO EXTREMITY (CUSTOM PROCEDURE TRAY) ×2 IMPLANT
PAD ARMBOARD 7.5X6 YLW CONV (MISCELLANEOUS) ×4 IMPLANT
PADDING CAST COTTON 6X4 STRL (CAST SUPPLIES) ×6 IMPLANT
SET HNDPC FAN SPRY TIP SCT (DISPOSABLE) IMPLANT
SPONGE GAUZE 4X4 12PLY (GAUZE/BANDAGES/DRESSINGS) ×2 IMPLANT
SPONGE LAP 18X18 X RAY DECT (DISPOSABLE) ×3 IMPLANT
SPONGE SCRUB IODOPHOR (GAUZE/BANDAGES/DRESSINGS) ×2 IMPLANT
STAPLER VISISTAT 35W (STAPLE) ×1 IMPLANT
STOCKINETTE IMPERVIOUS LG (DRAPES) ×2 IMPLANT
STRIP CLOSURE SKIN 1/2X4 (GAUZE/BANDAGES/DRESSINGS) IMPLANT
SUCTION FRAZIER TIP 10 FR DISP (SUCTIONS) IMPLANT
SUT ETHILON 3 0 PS 1 (SUTURE) IMPLANT
SUT VIC AB 0 CT1 27 (SUTURE) ×4
SUT VIC AB 0 CT1 27XBRD ANBCTR (SUTURE) ×2 IMPLANT
SUT VIC AB 2-0 CT1 27 (SUTURE) ×4
SUT VIC AB 2-0 CT1 TAPERPNT 27 (SUTURE) ×2 IMPLANT
SYR CONTROL 10ML LL (SYRINGE) IMPLANT
TOWEL OR 17X24 6PK STRL BLUE (TOWEL DISPOSABLE) ×4 IMPLANT
TOWEL OR 17X26 10 PK STRL BLUE (TOWEL DISPOSABLE) ×4 IMPLANT
TUBE CONNECTING 12X1/4 (SUCTIONS) ×2 IMPLANT
UNDERPAD 30X30 INCONTINENT (UNDERPADS AND DIAPERS) ×2 IMPLANT
VAC GRANUFOAM DRESSING SMALL ×1 IMPLANT
WATER STERILE IRR 1000ML POUR (IV SOLUTION) ×4 IMPLANT
YANKAUER SUCT BULB TIP NO VENT (SUCTIONS) ×2 IMPLANT

## 2011-09-26 NOTE — Consult Note (Signed)
Infectious Diseases Initial Consultation                Day 1 vancomycin  Date of Admission:  09/26/2011  Date of Consult:  09/26/2011  Reason for Consult: Left leg tibial osteomyelitis Referring Physician: Dr. Myrene Galas   Problem List:  Principal Problem:  *Osteomyelitis of left leg Active Problems:  Tibial plateau fracture  TOBACCO DEPENDENCE  Asthma   Recommendations: 1. Agree with empiric vancomycin pending operative cultures   Assessment: I called [facility] and check the electronic records here but did not locate any previous wound drainage cultures. Her operative Gram stain today he shows a few white blood cells but no organisms. The fact that she had some improvement recently while on oral cephalexin suggests that she has a fairly sensitive bug, most likely MSSA. I agree with continuing empiric vancomycin pending operative cultures. Depending on culture results she may be best served by having a PICC placed followed by a 4-6 weeks of IV antibiotic therapy. I will follow with you.    HPI: [name] is a 41 y.o. female who fell off her report she then suffered a closed left tibial plateau fracture last September. She underwent internal fixation had difficulty with postoperative wound healing. She says that one small area of the wound continued to be red and swollen with some drainage. She has not had any fever, chills, or sweats. She recalls being on multiple rounds of different oral antibiotics, most recently cephalexin. She recalls that she would notice improvement in redness and drainage while on the antibiotic but it would simply come back and worsen when she stopped it. As a result she was admitted today and underwent removal of internal fixation and had debridement of infected bone.   Review of Systems: Pertinent items are noted in HPI.     Marland Kitchen HYDROmorphone      . methocarbamol(ROBAXIN) IV  500 mg Intravenous To NPACU    Past Medical History  Diagnosis  Date  . Asthma     History  Substance Use Topics  . Smoking status: Current Everyday Smoker -- 0.5 packs/day  . Smokeless tobacco: Not on file  . Alcohol Use: 0.0 oz/week    1-2 Cans of beer per week    No family history on file. Allergies  Allergen Reactions  . Codeine Itching    OBJECTIVE: Blood pressure 148/70, pulse 70, temperature 98.2 F (36.8 C), temperature source Oral, resp. rate 20, SpO2 99.00%. General: She is groggy but conversant in the recovery room Skin: Is repeatedly scratching her nose probably related to her narcotic pain medication. She has no rash. Lungs: Clear Cor: Regular S1 and S2 with no murmurs Her left leg is wrapped  Microbiology: Recent Results (from the past 240 hour(s))  SURGICAL PCR SCREEN     Status: Normal   Collection Time   09/25/11  8:39 AM      Component Value Range Status Comment   MRSA, PCR NEGATIVE  NEGATIVE  Final    Staphylococcus aureus NEGATIVE  NEGATIVE  Final   URINE CULTURE     Status: Normal   Collection Time   09/25/11  8:40 AM      Component Value Range Status Comment   Specimen Description URINE, CLEAN CATCH   Final    Special Requests NONE   Final    Setup Time [id number]   Final    Colony Count NO GROWTH   Final    Culture NO GROWTH  Final    Report Status 09/26/2011 FINAL   Final   GRAM STAIN     Status: Normal   Collection Time   09/26/11 11:13 AM      Component Value Range Status Comment   Specimen Description WOUND LEFT LEG   Final    Special Requests NONE   Final    Gram Stain     Final    Value: FEW WBC PRESENT,BOTH PMN AND MONONUCLEAR     NO ORGANISMS SEEN     Gram Stain Report Called to,Read Back By and Verified With: L. Potter RN 11:45 09/26/11 ([email or web address])   Report Status 09/26/2011 FINAL   Final     Cliffton Asters, MD [facility] [phone] pager   [phone] cell 09/26/2011, 1:03 PM

## 2011-09-26 NOTE — Transfer of Care (Signed)
Immediate Anesthesia Transfer of Care Note  Patient: [name]  Procedure(s) Performed:  EXTERNAL FIXATION LEG -  Removal of Hardware Left Tibia with application wound vac  Patient Location: PACU  Anesthesia Type: General  Level of Consciousness: awake, alert  and oriented  Airway & Oxygen Therapy: Patient Spontanous Breathing and Patient connected to nasal cannula oxygen  Post-op Assessment: Report given to PACU RN and Post -op Vital signs reviewed and stable  Post vital signs: Reviewed and stable Filed Vitals:   09/26/11 1215  BP:   Pulse:   Temp: 36.8 C  Resp:     Complications: No apparent anesthesia complications

## 2011-09-26 NOTE — Progress Notes (Signed)
CARE MANAGEMENT NOTE 09/26/2011 Patient is going to need a wound vac for home.Patient has no insurance, states she started Medicaid process on her hospital admission in December. Contacted Brandi with financial services, she will followup. Began application process for charitiy wound vac thru [employer], left on shadow chart for PA to complete. Entered  request for [facility] RN/PT in [place].Infectious disease will see patient to determine if she will need PICC and IV antibiotics. Will follow.

## 2011-09-26 NOTE — H&P (Signed)
I have seen and examined the patient. I agree with the findings above. I discussed with the patient the risks and benefits of surgery, including the possibility of failure to resolve infection, nerve injury, vessel injury, recurrent wound breakdown, arthritis, symptomatic hardware, DVT/ PE, loss of motion, and need for further surgery among others.  We also specifically discussed the soft tissue breakdown that could lead to amputation.  She understands these risks and wishes to proceed.   Nigel Ericsson H 09/26/2011 9:53 AM

## 2011-09-26 NOTE — Anesthesia Postprocedure Evaluation (Signed)
  Anesthesia Post-op Note  Patient: [name]  Procedure(s) Performed:  EXTERNAL FIXATION LEG -  Removal of Hardware Left Tibia with application wound vac  Patient Location: PACU  Anesthesia Type: General  Level of Consciousness: awake and alert   Airway and Oxygen Therapy: Patient Spontanous Breathing  Post-op Pain: mild  Post-op Assessment: Post-op Vital signs reviewed, Patient's Cardiovascular Status Stable, Respiratory Function Stable, Patent Airway and No signs of Nausea or vomiting  Post-op Vital Signs: Reviewed and stable  Complications: No apparent anesthesia complications

## 2011-09-26 NOTE — Preoperative (Signed)
Beta Blockers   Reason not to administer Beta Blockers:Not Applicable 

## 2011-09-26 NOTE — Progress Notes (Signed)
ANTIBIOTIC CONSULT NOTE - INITIAL  Pharmacy Consult for Vancomycin Indication: Left leg tibial osteomyelitis  Allergies  Allergen Reactions  . Codeine Itching    Patient Measurements: Height: 5\' 4"  (162.6 cm) Weight: 150 lb (68.04 kg) IBW/kg (Calculated) : 54.7    Vital Signs: Temp: 97 F (36.1 C) (01/15 1318) Temp src: Oral (01/15 0803) BP: 148/69 mmHg (01/15 1308) Pulse Rate: 77  (01/15 1318) Intake/Output from previous day:   Intake/Output from this shift: Total I/O In: 1200 [I.V.:1200] Out: 200 [Urine:150; Blood:50]  Labs:  [facility] 09/25/11 0839  WBC 9.7  HGB 17.6*  PLT 224  LABCREA --  CREATININE 0.57   Estimated Creatinine Clearance: 86.8 ml/min (by C-G formula based on Cr of 0.57). No results found for this basename: VANCOTROUGH:2,VANCOPEAK:2,VANCORANDOM:2,GENTTROUGH:2,GENTPEAK:2,GENTRANDOM:2,TOBRATROUGH:2,TOBRAPEAK:2,TOBRARND:2,AMIKACINPEAK:2,AMIKACINTROU:2,AMIKACIN:2, in the last 72 hours   Microbiology: Recent Results (from the past 720 hour(s))  SURGICAL PCR SCREEN     Status: Normal   Collection Time   09/25/11  8:39 AM      Component Value Range Status Comment   MRSA, PCR NEGATIVE  NEGATIVE  Final    Staphylococcus aureus NEGATIVE  NEGATIVE  Final   URINE CULTURE     Status: Normal   Collection Time   09/25/11  8:40 AM      Component Value Range Status Comment   Specimen Description URINE, CLEAN CATCH   Final    Special Requests NONE   Final    Setup Time [id number]   Final    Colony Count NO GROWTH   Final    Culture NO GROWTH   Final    Report Status 09/26/2011 FINAL   Final   GRAM STAIN     Status: Normal   Collection Time   09/26/11 11:13 AM      Component Value Range Status Comment   Specimen Description WOUND LEFT LEG   Final    Special Requests NONE   Final    Gram Stain     Final    Value: FEW WBC PRESENT,BOTH PMN AND MONONUCLEAR     NO ORGANISMS SEEN     Gram Stain Report Called to,Read Back By and Verified With: L. Potter RN  11:45 09/26/11 ([email or web address])   Report Status 09/26/2011 FINAL   Final     Medical History: Past Medical History  Diagnosis Date  . Asthma     Medications:  Prescriptions prior to admission  Medication Sig Dispense Refill  . albuterol (PROVENTIL HFA;VENTOLIN HFA) 108 (90 BASE) MCG/ACT inhaler Inhale 2 puffs into the lungs every 6 (six) hours as needed.      . Calcium Carbonate-Vitamin D (CALTRATE 600+D) 600-400 MG-UNIT per tablet Take 1 tablet by mouth daily.      Marland Kitchen oxyCODONE-acetaminophen (PERCOCET) 7.5-325 MG per tablet Take 1 tablet by mouth every 6 (six) hours as needed. For pain      . DISCONTD: ibuprofen (ADVIL,MOTRIN) 200 MG tablet Take 400 mg by mouth every 8 (eight) hours as needed. For pain       Scheduled:    . calcium-vitamin D  1 tablet Oral Daily  . docusate sodium  100 mg Oral BID  . enoxaparin  40 mg Subcutaneous Q24H  . HYDROmorphone      . methocarbamol(ROBAXIN) IV  500 mg Intravenous To NPACU  . rifampin  300 mg Oral Q12H  . DISCONTD: Calcium Carbonate-Vitamin D  1 tablet Oral Daily   Assessment: 41 yo female s/p removal of hardware left tibia due to  Osteomyelitis,  nonhealing wound left medial leg.  ID notes pt had some improvement recently while on oral cephalexin suggesting that she has a fairly sensitive bug, most likely MSSA.Vancomycin 1gm IV given preop @ 11:15. ID agrees with empiric Vancomycin pending operative cultures.  Rifampin also started.   Goal of Therapy:  Vancomycin trough level 15-20 mcg/ml  Plan:  Vancomycin 750mg  IV q8h. Check Vancomycin trough at steady state if Vancomycin continued.  F/u cultures.   Arman Filter 09/26/2011,2:45 PM

## 2011-09-26 NOTE — Anesthesia Procedure Notes (Signed)
Procedure Name: Intubation Date/Time: 09/26/2011 10:33 AM Performed by: Delbert Harness Pre-anesthesia Checklist: Patient identified, Timeout performed, Emergency Drugs available, Suction available and Patient being monitored Patient Re-evaluated:Patient Re-evaluated prior to inductionOxygen Delivery Method: Circle System Utilized Preoxygenation: Pre-oxygenation with 100% oxygen Intubation Type: IV induction Ventilation: Mask ventilation without difficulty Laryngoscope Size: Mac and 3 Grade View: Grade I Tube type: Oral Tube size: 7.0 mm Number of attempts: 1 Airway Equipment and Method: stylet Secured at: 21 cm Tube secured with: Tape Dental Injury: Teeth and Oropharynx as per pre-operative assessment

## 2011-09-26 NOTE — Brief Op Note (Signed)
09/26/2011  11:40 AM  PATIENT:  [name]  41 y.o. female  PRE-OPERATIVE DIAGNOSIS:  Left Tibia Deep Infection, nonhealing wound left medial leg, knee contracture 75 degrees   POST-OPERATIVE DIAGNOSIS:  left tibia deep infection,nonhealing wound left medial leg,  knee contracture 75 degrees   PROCEDURE:  Procedure(s): Removal deep implant left tibia Partial excision of bone left tibia for infection Manipulation of knee under anesthesia, increasing to 135 degrees Placement of wound vac, small Stress flouroscopy left tibia fracture site  SURGEON:  Surgeon(s): [employer]  PHYSICIAN ASSISTANT: Montez Morita, [facility]  ANESTHESIA:   general  EBL:  Total I/O In: -  Out: 200 [Urine:150; Blood:50]  BLOOD ADMINISTERED:none  DRAINS: wound vac  LOCAL MEDICATIONS USED:  NONE  SPECIMEN:  Source of Specimen:  deep tissue left medial wound and periplate fluid  DISPOSITION OF SPECIMEN:  micro  COUNTS:  YES  TOURNIQUET:  * Missing tourniquet times found for documented tourniquets in log:  18304 *  DICTATION: .Other Dictation: Dictation Number [id number]  PLAN OF CARE: Admit to inpatient   PATIENT DISPOSITION:  PACU - hemodynamically stable.   Delay start of Pharmacological VTE agent (>24hrs) due to surgical blood loss or risk of bleeding:  No

## 2011-09-26 NOTE — Progress Notes (Addendum)
Pt is s/p hardware removal and vac placement to L tibia. Pt, per rept, has had 4 surgeries to LLE. Pt, per rept, has osteomyelitis of LLE. Pt has an ace dressing of LLE that is dry and intact with wound vac at 125 mmhg. Vac only draining small amount of bldy serosanquinous drainage. Ice to LLE. Elevating LLE to prevent/minimize edema. Pt is to be TDWB with RW of LLE. +cms. Pt is alert and oriented x 3. Pt lungs CTA. Pt instructed IS and pt states, "Yep, I know that thing". No s/sx resp or cardiac distress and no c/o such.Vital signs stable. Pt is to void since surgery. Abdomen soft flat nontender and nondistended. BS+x4. Pt denies nausea or vomiting.

## 2011-09-26 NOTE — Anesthesia Preprocedure Evaluation (Addendum)
Anesthesia Evaluation  Patient identified by MRN, date of birth, ID band Patient awake    Reviewed: Allergy & Precautions, H&P , NPO status , Patient's Chart, lab work & pertinent test results  Airway Mallampati: II TM Distance: >3 FB Neck ROM: Full    Dental No notable dental hx. (+) Teeth Intact   Pulmonary asthma ,  + rhonchi  Pulmonary exam normal       Cardiovascular neg cardio ROS Regular Normal    Neuro/Psych Negative Neurological ROS  Negative Psych ROS   GI/Hepatic negative GI ROS, Neg liver ROS,   Endo/Other  Negative Endocrine ROS  Renal/GU negative Renal ROS  Genitourinary negative   Musculoskeletal   Abdominal   Peds  Hematology negative hematology ROS (+)   Anesthesia Other Findings   Reproductive/Obstetrics negative OB ROS                           Anesthesia Physical Anesthesia Plan  ASA: II  Anesthesia Plan: General   Post-op Pain Management:    Induction: Intravenous  Airway Management Planned: Oral ETT  Additional Equipment:   Intra-op Plan:   Post-operative Plan:   Informed Consent: I have reviewed the patients History and Physical, chart, labs and discussed the procedure including the risks, benefits and alternatives for the proposed anesthesia with the patient or authorized representative who has indicated his/her understanding and acceptance.     Plan Discussed with: CRNA  Anesthesia Plan Comments:         Anesthesia Quick Evaluation

## 2011-09-26 NOTE — Progress Notes (Signed)
Orthopedic Tech Progress Note Patient Details:  [name] 1970/10/12 [id number]   trapeze bar    Cammer, Mickie Bail 09/26/2011, 5:37 PM

## 2011-09-27 LAB — HEPATIC FUNCTION PANEL
ALT: 8 U/L (ref 0–35)
AST: 14 U/L (ref 0–37)
Albumin: 2.8 g/dL — ABNORMAL LOW (ref 3.5–5.2)
Indirect Bilirubin: 0.9 mg/dL (ref 0.3–0.9)
Total Protein: 5.5 g/dL — ABNORMAL LOW (ref 6.0–8.3)

## 2011-09-27 LAB — BASIC METABOLIC PANEL
CO2: 26 mEq/L (ref 19–32)
Chloride: 102 mEq/L (ref 96–112)
Creatinine, Ser: 0.52 mg/dL (ref 0.50–1.10)

## 2011-09-27 LAB — CBC
HCT: 40 % (ref 36.0–46.0)
MCV: 93.9 fL (ref 78.0–100.0)
RDW: 14.5 % (ref 11.5–15.5)
WBC: 8 10*3/uL (ref 4.0–10.5)

## 2011-09-27 MED ORDER — SODIUM CHLORIDE 0.9 % IJ SOLN
10.0000 mL | INTRAMUSCULAR | Status: DC | PRN
  Administered 2011-09-28 – 2011-09-29 (×3): 10 mL

## 2011-09-27 MED ORDER — VITAMIN C 500 MG PO TABS
500.0000 mg | ORAL_TABLET | Freq: Every day | ORAL | Status: DC
  Administered 2011-09-27 – 2011-09-29 (×3): 500 mg via ORAL
  Filled 2011-09-27 (×3): qty 1

## 2011-09-27 MED ORDER — FLUCONAZOLE 150 MG PO TABS
150.0000 mg | ORAL_TABLET | Freq: Once | ORAL | Status: AC
  Administered 2011-09-27: 150 mg via ORAL
  Filled 2011-09-27: qty 1

## 2011-09-27 MED ORDER — PANTOPRAZOLE SODIUM 40 MG PO TBEC
40.0000 mg | DELAYED_RELEASE_TABLET | Freq: Every day | ORAL | Status: DC
  Administered 2011-09-27 – 2011-09-29 (×3): 40 mg via ORAL
  Filled 2011-09-27 (×3): qty 1

## 2011-09-27 NOTE — Progress Notes (Signed)
Pt is s/p hardware removal from L leg due to infection. Pt has a dry ace dressing of LLE. 1+ nonpitting edema noted of L foot. Ice prn L foot. Pt elevates LLE to prevent/minimize edema. Pt has a wound vac to LLE that is draning small amount of serosanquinous drainage. Wound vac at 125 mmhg. Pt is to be WBAT with crutches today with PT. CPM ordered from ortho per MD order. Pt is alert and oriented x 3. Neuro check is negative. Pt lungs CTA. Pt performs IS per order. No s/sx resp or cardiac distress and no c/o such. Pt repts nonprod cough. Pt abdomen is soft flat nontender and nondistended. Pt repts LBM 1/14. Pt denies nausea or vomiting and denies passing gas. Pt BS noted to be sluggish x4. Pt voids quantity sufficient without difficulty.

## 2011-09-27 NOTE — Progress Notes (Signed)
09/27/2011 Physical Therapy Note:  [name] declines PT today as she is well-versed in crutch use, and has no questions re: functional mobility;  We briefly discussed her WBAT status, she verbalized understanding; she had questions about therex she can perform to address knee ROM, briefly described what she does, and we discussed using an ankle prop/roll; I provided her with a handout for knee therex, and minimally adjusted her CPM to maximize extension range;  Recommend Outpt PT follow up;   Will sign off and complete the PT order -- will be happy to reconsult if [name] has any questions re: amb with WBAT status, or stair training (would need a new order)  Thanks,  Van Clines,  [phone]

## 2011-09-27 NOTE — Progress Notes (Addendum)
INFECTIOUS DISEASE PROGRESS NOTE    Date of Admission:  09/26/2011   Total days of antibiotics 2        Vanc 1/15 -->   Problem List:  1. Osteomyelitis of left leg 2. Tibial plateau fracture 3. TOBACCO DEPENDENCE 4.  Asthma     . calcium-vitamin D  1 tablet Oral Daily  . docusate sodium  100 mg Oral BID  . enoxaparin  40 mg Subcutaneous Q24H  . fluconazole  150 mg Oral Once  . HYDROmorphone      . rifampin  300 mg Oral Q12H  . vancomycin  750 mg Intravenous Q8H  . vitamin C  500 mg Oral Daily  . DISCONTD: Calcium Carbonate-Vitamin D  1 tablet Oral Daily   Subjective: Feeling pretty good today.  Pain well controlled and she is eating well.  Denies fevers, chills, nausea, vomiting, or redness/warmth in the left leg.  We discussed her course since her fracture.  She was given a course of cefadroxil following her initial presentation that stopped on 9/24.  In October she was given a course of doxycycline.  In December she was given a 7 day course of Augmentin, and on 1/10 she was given a 5 day course of Keflex.  She states that every 4-6 weeks she would have swelling around the medial incision on her left knee that would drain yellow creamy drainage and that after each of the antibiotic courses it would clear completely with just mild drainage from the wound with a clear yellow drainage.    Objective: Temp:  [97.7 F (36.5 C)-98.5 F (36.9 C)] 98.5 F (36.9 C) (01/16 0636) Pulse Rate:  [69-93] 69  (01/16 0636) Resp:  [18] 18  (01/16 0636) BP: (103-148)/(68-85) 103/68 mmHg (01/16 0636) SpO2:  [92 %-97 %] 94 % (01/16 0636)  Vitals reviewed. General: resting in bed, NAD HEENT: PERRL, EOMI, no scleral icterus Cardiac: RRR, no rubs, murmurs or gallops Pulm: clear to auscultation bilaterally, no wheezes, rales, or rhonchi Abd: soft, nontender, nondistended, BS present Ext: good movement and pulses in the bilateral DP and PT.  ACE bandage with wound vac in place from the mid thigh to  ankle.  Drainage from vac noted to be serosanguinous.  warm and well perfused, trace pedal edema in the left foot.  Neuro: alert and oriented X3, cranial nerves II-XII grossly intact, strength and sensation to light touch equal in bilateral upper and lower extremities  Lab Results Lab Results  Component Value Date   WBC 8.0 09/27/2011   HGB 13.4 09/27/2011   HCT 40.0 09/27/2011   MCV 93.9 09/27/2011   PLT 177 09/27/2011    Lab Results  Component Value Date   CREATININE 0.52 09/27/2011   BUN 8 09/27/2011   NA 134* 09/27/2011   K 3.7 09/27/2011   CL 102 09/27/2011   CO2 26 09/27/2011    Lab Results  Component Value Date   ALT 8 09/27/2011   AST 14 09/27/2011   ALKPHOS 74 09/27/2011   BILITOT 1.4* 09/27/2011    Microbiology: Recent Results (from the past 240 hour(s))  SURGICAL PCR SCREEN     Status: Normal   Collection Time   09/25/11  8:39 AM      Component Value Range Status Comment   MRSA, PCR NEGATIVE  NEGATIVE  Final    Staphylococcus aureus NEGATIVE  NEGATIVE  Final   URINE CULTURE     Status: Normal   Collection Time   09/25/11  8:40 AM      Component Value Range Status Comment   Specimen Description URINE, CLEAN CATCH   Final    Special Requests NONE   Final    Setup Time [id number]   Final    Colony Count NO GROWTH   Final    Culture NO GROWTH   Final    Report Status 09/26/2011 FINAL   Final   GRAM STAIN     Status: Normal   Collection Time   09/26/11 11:13 AM      Component Value Range Status Comment   Specimen Description WOUND LEFT LEG   Final    Special Requests NONE   Final    Gram Stain     Final    Value: FEW WBC PRESENT,BOTH PMN AND MONONUCLEAR     NO ORGANISMS SEEN     Gram Stain Report Called to,Read Back By and Verified With: L. Potter RN 11:45 09/26/11 ([email or web address])   Report Status 09/26/2011 FINAL   Final   WOUND CULTURE     Status: Normal (Preliminary result)   Collection Time   09/26/11 11:13 AM      Component Value Range Status Comment   Specimen  Description WOUND LEFT LEG   Final    Special Requests NONE   Final    Gram Stain     Final    Value: FEW WBC PRESENT,BOTH PMN AND MONONUCLEAR     NO ORGANISMS SEEN     Gram Stain Report Called to,Read Back By and Verified With: Gram Stain Report Called to,Read Back By and Verified With: L POTTER RN [id number] 09/26/11 WILSONM Performed at [facility]   Culture NO GROWTH 1 DAY   Final    Report Status PENDING   Incomplete   ANAEROBIC CULTURE     Status: Normal (Preliminary result)   Collection Time   09/26/11 11:13 AM      Component Value Range Status Comment   Specimen Description WOUND LEFT LEG   Final    Special Requests NONE   Final    Gram Stain PENDING   Incomplete    Culture     Final    Value: NO ANAEROBES ISOLATED; CULTURE IN PROGRESS FOR 5 DAYS   Report Status PENDING   Incomplete    Studies/Results: Dg Femur Left  09/26/2011  *RADIOLOGY REPORT*  Clinical Data: Tibial fractures; hardware removal  LEFT FEMUR - 2 VIEW  Comparison: CT scout dated September 21, 2011  Findings: The medial side plate has been removed.  The alignment remains anatomic.  IMPRESSION: Removal of medial side plate and screws.  Original Report Authenticated By: Brandon Melnick, M.D.   Dg Knee Left Port  09/26/2011  *RADIOLOGY REPORT*  Clinical Data: Comminuted proximal tibia fracture.  Removal of hardware.  PORTABLE LEFT KNEE - 1-2 VIEW  Comparison: CT scan dated 09/21/2011 and intraoperative images dated 09/26/2011  Findings: Medial hardware has been removed.  Lateral hardware remains.  Portions of the fracture lines are still apparent.  IMPRESSION: Removal of medial hardware from the proximal tibia.  No change in the appearance of the healing fractures of the proximal tibia.  Original Report Authenticated By: Gwynn Burly, M.D.   Assessment: 1.  Internal fixation of tibial plateau fracture complicated by infection: Medial hardware is out with the lateral hardware still in place following surgery yesterday.   I discussed with the Ortho PA, Mellody Dance what they saw in surgery and he states that it was  right down to the bone.  Cultures are still pending but the gram stains showed no organisms.  She is currently on empiric vancomycin.  With the fact that she states that her infections have cleared with each course of antibiotics from tetracyclines to beta lactams to cephalosporins indicate that if there is an infection it is likely a fairly sensitive bug like MSSA.  We will continue to follow the cultures and will tailor antibiotic therapy if anything grows out.  With the fact that she continues to have retained hardware on the lateral side that could be seeded we will need the rifampin to cover for biofilm.   Plan: 1. After discussion with Dr. Orvan Falconer we would recommend changing Vancomycin to Ancef 2 g q8.   2. Continue the Rifampin 300 mg BID. 3. She will need a PICC line prior to discharge for continue IV Ancef at home for a total of 6 weeks of therapy.   4. She should have a follow up appointment with Dr. Orvan Falconer about 1 month after discharge.  We will continue to follow and assist in making the appointment before discharge.   PRIBULA,CHRISTOPHER 09/27/2011 2:21 PM   Attending addendum: Her transient improvement with recent antibiotic therapy suggests that she probably has a relatively sensitive organism causing her tibial osteomyelitis. Methicillin sensitive staph aureus would be the most likely culprit. I agree with treating with IV Ancef. The medial hardware at the site of osteomyelitis was removed but I cannot absolutely determine the proximity of the lateral hardware to the site of infection so continuing rifampin for now seems reasonable.  Cliffton Asters, MD [facility] [phone] pager   [phone] cell 09/27/2011, 4:23 PM

## 2011-09-27 NOTE — Op Note (Signed)
NAME:  [name], [name]                    ACCOUNT NO.:  MEDICAL RECORD NO.:  [id number]  LOCATION:                                 FACILITY:  PHYSICIAN:  Doralee Albino. Carola Frost, M.D. DATE OF BIRTH:  1971-01-19  DATE OF PROCEDURE:  09/26/2011 DATE OF DISCHARGE:                              OPERATIVE REPORT   PREOPERATIVE DIAGNOSES:  Left tibia infection, second toe nonhealing wound, left medial leg.  POSTOPERATIVE DIAGNOSES:  Left tibia infection, second toe nonhealing wound, left medial leg.  PROCEDURES: 1. Removal of deep implant, left tibia. 2. Partial excision of bone, left tibia, for infection. 3. Placement of wound VAC, small. 4. Stress fluoroscopy, left tibia fracture site.  SURGEON:  Doralee Albino. Carola Frost, MD  ASSISTANT:  Mearl Latin, PA-C  ANESTHESIA:  General.  COMPLICATIONS:  None.  SPECIMENS:  Two anaerobic, aerobic from fluid around the plate in the deep recesses of the wound to microbiology.  EBL:  Minimal.  DRAINS:  Again, small wound VAC.  DISPOSITION:  PACU.  CONDITION:  Stable.  BRIEF SUMMARY OF INDICATION FOR PROCEDURE:  [name] is a 41 year old female status post severe bicondylar tibial plateau and shaft fracture, treated with staged ORIF utilizing both a lateral and medial approach.  The patient continued to smoke in the postoperative period and developed a small medial wound, which failed to heal.  This has failed several nonsurgical methods for wound care, and now she presents for elective removal now that she is 3-1/2 months out.  I did discuss with her the potential for persistent nonunion, need to remove all of her hardware including the lateral plate, possibility of conversion to a Ladona Ridgel spatial frame in the event of significant nonunion, also DVT, PE, nerve injury, vessel injury, infection, failure to resolve her infection, need for further surgery and others.  We furthermore discussed specifically her contracture, which was quite severe  given her lack of physical therapy in the postop period and failure to initiate motion.  She did request a manipulation and understood the risk of recurrence of her fracture displacement.  BRIEF SUMMARY OF PROCEDURE:  The patient was taken the OR without administration of preoperative antibiotics until cultures were obtained. General anesthesia was induced.  Left lower extremity was then examined to evaluate knee range of motion.  She had approximately 5-degree flexion contracture with a soft end point as well as flexion to 75 degrees.  With gentle progressive application of force, we were able to bring her into full extension and also to flex her to 135 degrees. There was just gentle giving way of her arthrofibrosis with no acute changes.  The left lower extremity then underwent a standard sterile prep and drape.  The medial incision was extended proximally and distally.  The eschar was removed from the central portion.  There was gray, poorly vascularized tissue underneath the eschar.  Some of this area was trimmed going down deep to the plate, which was not readily visible.  I did encounter some serous fluid, but no purulence along the plate, which was sent for anaerobic and aerobic cultures to micro.  The screws within the plate were then  exposed and removed without complication.  They were well fixed.  There was again no purulence. Regrettably, some of the fracture site and graft was exposed.  The loose bone including a cortical fragment was debrided with the rongeur and partially excised.  The remainder was left intact.  Fluoroscopy was then brought in and an aggressive stress under live fluoro was then performed, which did not demonstrate any motion or cantilever at the fracture site either under direct visualization through the wound or on the bigger fluoro image.  The wound was copiously irrigated with pulsatile saline from a bulb syringe and again the edges debrided.  It was  then reapproximated through the extended portions of the wound with 0 and 2-0 PDS and 3-0 nylon.  The open area of the wound was treated with a wound VAC.  Vertical mattress sutures were used to evert the wound edges adjacent to the open area.  Of note, the distal edge of the medial butterfly fragment at the time of her initial fixation did appear to be of poor vascularity and was quite white where the proximal aspect of the distal fragment did have a good periosteal growth.  More proximally, there was periosteal coverage of the fragment, but again exposed area of poorly vascularized cortical bone of approximately 2 cm in length, but some incorporated bone anteriorly and perhaps deep to this.  Montez Morita, PA-C, assisted me throughout including with wound closure, which did require an assistant to mobilize the soft tissue edges and also for stabilization during stress fluoroscopy and exposure of the hardware for removal.  PROGNOSIS:  [name] was started on antibiotics immediately after obtaining the cultures, and we will consult Infectious Diseases regarding the antibiotic choice for long-term management.  Sed rate and CRP were not elevated and will therefore offer little help in following this now by definition of ostia given her bone exposure.  We anticipate use of the wound VAC until there is complete healing and closure.  She will continue to be weightbearing as tolerated with aggressive range of motion of the knee and ankle to maintain her motion.  Again, we have strongly encouraged and will continue to push for smoking cessation, which offers her the best chance at restoring vascularity and allowing for eventual wound healing.     Doralee Albino. Carola Frost, M.D.     MHH/MEDQ  D:  09/26/2011  T:  09/27/2011  Job:  [id number]

## 2011-09-27 NOTE — Progress Notes (Signed)
Subjective: 1 Day Post-Op Procedure(s) (LRB): EXTERNAL FIXATION LEG (Left)  Doing suprisingly well Pain controlled Denies numbness and tingling Wants Iron levels checked Pretty good spirits Does c/o vaginal itching and burning, h/o yeast infections with abx use     Objective: Current Vitals Blood pressure 103/68, pulse 69, temperature 98.5 F (36.9 C), temperature source Oral, resp. rate 18, height 5\' 4"  (1.626 m), weight 68.04 kg (150 lb), SpO2 94.00%. Vital signs in last 24 hours: Temp:  [97 F (36.1 C)-98.5 F (36.9 C)] 98.5 F (36.9 C) (01/16 0636) Pulse Rate:  [65-93] 69  (01/16 0636) Resp:  [12-67] 18  (01/16 0636) BP: (103-180)/(68-85) 103/68 mmHg (01/16 0636) SpO2:  [92 %-99 %] 94 % (01/16 0636) Weight:  [68.04 kg (150 lb)] 68.04 kg (150 lb) (01/15 1318)  Intake/Output from previous day: 01/15 0701 - 01/16 0700 In: 2966.3 [P.O.:240; I.V.:2426.3; IV Piggyback:300] Out: 215 [Urine:150; Drains:15; Blood:50]  LABS  Basename 09/27/11 0535 09/25/11 0839  HGB 13.4 17.6*    Basename 09/27/11 0535 09/25/11 0839  WBC 8.0 9.7  RBC 4.26 5.39*  HCT 40.0 50.1*  PLT 177 224    Basename 09/27/11 0535 09/25/11 0839  NA 134* 137  K 3.7 4.0  CL 102 101  CO2 26 26  BUN 8 9  CREATININE 0.52 0.57  GLUCOSE 107* 104*  CALCIUM 8.2* 9.2    Basename 09/25/11 0839  LABPT --  INR 0.91   Op cultures: NGTD, gm stain negative  Physical Exam  Gen:NAD Lungs:clear anteriorly Cardiac:S1 and S2 Abd:+ BS Ext: Left Lower Extremity  VAC with approx 50cc output post op  dsg c/d/i  Distal motor and sensory functions intact  Ext warm  Pt with pillow under knee, with knee bent   Imaging  Chest 2 View  09/25/2011  *RADIOLOGY REPORT*  Clinical Data: Preop  CHEST - 2 VIEW  Comparison: 05/13/2011  Findings: Cardiomediastinal silhouette is stable.  Mild thoracic dextroscoliosis again noted.  No acute infiltrate or pleural effusion.  No pulmonary edema.  IMPRESSION: No active  disease.  No significant change.  Original Report Authenticated By: Natasha Mead, M.D.   Dg Femur Left  09/26/2011  *RADIOLOGY REPORT*  Clinical Data: Tibial fractures; hardware removal  LEFT FEMUR - 2 VIEW  Comparison: CT scout dated September 21, 2011  Findings: The medial side plate has been removed.  The alignment remains anatomic.  IMPRESSION: Removal of medial side plate and screws.  Original Report Authenticated By: Brandon Melnick, M.D.   Dg Knee Left Port  09/26/2011  *RADIOLOGY REPORT*  Clinical Data: Comminuted proximal tibia fracture.  Removal of hardware.  PORTABLE LEFT KNEE - 1-2 VIEW  Comparison: CT scan dated 09/21/2011 and intraoperative images dated 09/26/2011  Findings: Medial hardware has been removed.  Lateral hardware remains.  Portions of the fracture lines are still apparent.  IMPRESSION: Removal of medial hardware from the proximal tibia.  No change in the appearance of the healing fractures of the proximal tibia.  Original Report Authenticated By: Gwynn Burly, M.D.    Assessment/Plan: 1 Day Post-Op Procedure(s) (LRB): EXTERNAL FIXATION LEG (Left)  41 y/o female s/p ROH L medial proximal tibia secondary to osteomyelitis  1. L tibia osteo s/p ROH  Cont with IV vanc and rifampin due to retained lateral hardware  VAC  Pt will need home vac until medial wound closes, pt at high risk for chronic wound, chronic infection and potentially amputation if she goes without VAC  Greatly appreciate ID assistance  2. L knee MUA  Will get in CPM today 3. Nicotine Dependence  Again reviewed what is at stake with the pt regarding overall healing.  States she will attempt to give up nicotine 4. Asthma 5. DVT/PE prophylaxis  lovenox while inpatient 6. Activity  PT consult  WBAT L leg  ROM as tolerated, no restrictions  Total knee precautions 7. Pain  Continue to encourage PO meds 8. FEN  Mild hyponatremia, will kvo ivf  Diet as tolerated  Add vitamin c  9. Vaginal  candidiasis  Diflucan x 1, repeat in 2-3 days if persists 10. Dispo  Await final cultures  May need PICC based on findings  Home VAC     Mearl Latin, PA-C 09/27/2011, 8:36 AM

## 2011-09-28 LAB — FERRITIN: Ferritin: 94 ng/mL (ref 10–291)

## 2011-09-28 LAB — DIFFERENTIAL
Basophils Relative: 0 % (ref 0–1)
Eosinophils Absolute: 0.1 10*3/uL (ref 0.0–0.7)
Eosinophils Relative: 2 % (ref 0–5)
Monocytes Absolute: 0.5 10*3/uL (ref 0.1–1.0)
Monocytes Relative: 9 % (ref 3–12)
Neutrophils Relative %: 50 % (ref 43–77)

## 2011-09-28 LAB — BASIC METABOLIC PANEL
BUN: 5 mg/dL — ABNORMAL LOW (ref 6–23)
Creatinine, Ser: 0.5 mg/dL (ref 0.50–1.10)
GFR calc Af Amer: >90 mL/min (ref >90)
GFR calc non Af Amer: >90 mL/min (ref >90)

## 2011-09-28 LAB — RETICULOCYTES
Retic Count, Absolute: 77.5 10*3/uL (ref 19.0–186.0)
Retic Ct Pct: 1.9 % (ref 0.4–3.1)

## 2011-09-28 LAB — CBC
HCT: 38.3 % (ref 36.0–46.0)
Hemoglobin: 12.9 g/dL (ref 12.0–15.0)
MCH: 31.6 pg (ref 26.0–34.0)
MCHC: 33.7 g/dL (ref 30.0–36.0)

## 2011-09-28 NOTE — Progress Notes (Signed)
Out of sequence note:  09/26/11 1500 Vance Peper, RN BSN Case Manager Patient is going to need a wound vac for home.Patient has no insurance, states she started Medicaid process on her hospital admission in December. Contacted Brandi with financial services, she will followup. Began application process for charitiy wound vac thru [employer], left on shadow chart for PA to complete. Entered request for [facility] RN/PT in [facility].Infectious disease will see patient to determine if she will need PICC and IV antibiotics. Will follow.

## 2011-09-28 NOTE — Progress Notes (Addendum)
INFECTIOUS DISEASE PROGRESS NOTE    Date of Admission:  09/26/2011     Total days of antibiotics 3  Vanc 1/15 -->  Rifampin 1/15 -->   Problem List:  1. Osteomyelitis of left leg  2. Tibial plateau fracture  3. TOBACCO DEPENDENCE  4. Asthma     . calcium-vitamin D  1 tablet Oral Daily  . docusate sodium  100 mg Oral BID  . enoxaparin  40 mg Subcutaneous Q24H  . pantoprazole  40 mg Oral Q1200  . rifampin  300 mg Oral Q12H  . vancomycin  750 mg Intravenous Q8H  . vitamin C  500 mg Oral Daily   Subjective: Feeling sore this AM.  She didn't sleep well last night.  Was up with PT yesterday.  Denies fevers, chills, nausea, or vomiting.  Spoke with [employer].  Wound culture from surgery was noted as positive for multiple organisms.  Wound vac continues to drain serosanguinous drainage.   Objective: Temp:  [98.5 F (36.9 C)-99.4 F (37.4 C)] 98.5 F (36.9 C) (01/17 0700) Pulse Rate:  [63-79] 63  (01/17 0700) Resp:  [18] 18  (01/17 0700) BP: (145-158)/(81-85) 145/85 mmHg (01/17 0700) SpO2:  [93 %-97 %] 93 % (01/17 0700)  Vitals reviewed. General: resting in bed, NAD HEENT: PERRL, EOMI, no scleral icterus Cardiac: RRR, no rubs, murmurs or gallops Pulm: clear to auscultation bilaterally, no wheezes, rales, or rhonchi Abd: soft, nontender, nondistended, BS present Ext: ACE wrap in place on left leg.  Good pulses in foot and sensation intact. warm and well perfused, mild pedal edema on left leg.  Neuro: alert and oriented X3, cranial nerves II-XII grossly intact, strength and sensation to light touch equal in bilateral upper and lower extremities  Lab Results Lab Results  Component Value Date   WBC 5.6 09/28/2011   HGB 12.9 09/28/2011   HCT 38.3 09/28/2011   MCV 93.9 09/28/2011   PLT 176 09/28/2011    Lab Results  Component Value Date   CREATININE 0.50 09/28/2011   BUN 5* 09/28/2011   NA 137 09/28/2011   K 4.0 09/28/2011   CL 104 09/28/2011   CO2 26 09/28/2011    Lab  Results  Component Value Date   ALT 8 09/27/2011   AST 14 09/27/2011   ALKPHOS 74 09/27/2011   BILITOT 1.4* 09/27/2011    Microbiology: Recent Results (from the past 240 hour(s))  SURGICAL PCR SCREEN     Status: Normal   Collection Time   09/25/11  8:39 AM      Component Value Range Status Comment   MRSA, PCR NEGATIVE  NEGATIVE  Final    Staphylococcus aureus NEGATIVE  NEGATIVE  Final   URINE CULTURE     Status: Normal   Collection Time   09/25/11  8:40 AM      Component Value Range Status Comment   Specimen Description URINE, CLEAN CATCH   Final    Special Requests NONE   Final    Setup Time [id number]   Final    Colony Count NO GROWTH   Final    Culture NO GROWTH   Final    Report Status 09/26/2011 FINAL   Final   GRAM STAIN     Status: Normal   Collection Time   09/26/11 11:13 AM      Component Value Range Status Comment   Specimen Description WOUND LEFT LEG   Final    Special Requests NONE   Final  Gram Stain     Final    Value: FEW WBC PRESENT,BOTH PMN AND MONONUCLEAR     NO ORGANISMS SEEN     Gram Stain Report Called to,Read Back By and Verified With: L. Potter RN 11:45 09/26/11 ([email or web address])   Report Status 09/26/2011 FINAL   Final   WOUND CULTURE     Status: Normal (Preliminary result)   Collection Time   09/26/11 11:13 AM      Component Value Range Status Comment   Specimen Description WOUND LEFT LEG   Final    Special Requests NONE   Final    Gram Stain     Final    Value: FEW WBC PRESENT,BOTH PMN AND MONONUCLEAR     NO ORGANISMS SEEN     Gram Stain Report Called to,Read Back By and Verified With: Gram Stain Report Called to,Read Back By and Verified With: L POTTER RN 1145 09/26/11 WILSONM Performed at [facility]   Culture     Final    Value: MULTIPLE ORGANISMS PRESENT, NONE PREDOMINANT     Note: NO STAPHYLOCOCCUS AUREUS ISOLATED NO GROUP A STREP (S.PYOGENES) ISOLATED   Report Status PENDING   Incomplete   ANAEROBIC CULTURE     Status: Normal (Preliminary  result)   Collection Time   09/26/11 11:13 AM      Component Value Range Status Comment   Specimen Description WOUND LEFT LEG   Final    Special Requests NONE   Final    Gram Stain PENDING   Incomplete    Culture     Final    Value: NO ANAEROBES ISOLATED; CULTURE IN PROGRESS FOR 5 DAYS   Report Status PENDING   Incomplete     Studies/Results: Dg Femur Left  09/26/2011  *RADIOLOGY REPORT*  Clinical Data: Tibial fractures; hardware removal  LEFT FEMUR - 2 VIEW  Comparison: CT scout dated September 21, 2011  Findings: The medial side plate has been removed.  The alignment remains anatomic.  IMPRESSION: Removal of medial side plate and screws.  Original Report Authenticated By: Brandon Melnick, M.D.   Dg Knee Left Port  09/26/2011  *RADIOLOGY REPORT*  Clinical Data: Comminuted proximal tibia fracture.  Removal of hardware.  PORTABLE LEFT KNEE - 1-2 VIEW  Comparison: CT scan dated 09/21/2011 and intraoperative images dated 09/26/2011  Findings: Medial hardware has been removed.  Lateral hardware remains.  Portions of the fracture lines are still apparent.  IMPRESSION: Removal of medial hardware from the proximal tibia.  No change in the appearance of the healing fractures of the proximal tibia.  Original Report Authenticated By: Gwynn Burly, M.D.   Assessment: 1. Internal fixation of tibial plateau fracture complicated by infection: Medial hardware is out with the lateral hardware still in place following surgery yesterday. I discussed with the Ortho PA, Mellody Dance what they saw in surgery and he states that it was right down to the bone. Wound culture reported as multiple organisms with no S. Aureus or Group A strep.  Spoke with Hydrographic surveyor and she stated that two colony types are present including what appears to be a diphtheroid and the other is likely a coag neg staph.  She will replate and confirm as well as get susceptibilities done for [place].  The patient was never changed to Ancef and has continued to  receive vancomycin and rifampin.   Plan: 1. With the question of coag neg staph we will continue with Vancomycin and rifampin for now pending  susceptibilities.   2. PICC line placed and will place [facility] order today to facilitate discharge process.   3. Dr. Orvan Falconer will make a follow up with her in about 1 month after discharge.    [name],[name] 09/28/2011 11:10 AM   Attending addendum: Her partial response to oral antibiotics such as cephalexin suggests that her pathogen is fairly sensitive but since most of coag-negative staph are methicillin resistant I will continue vancomycin as the primary drug pending for susceptibilities. Once they're available moreover to make a final determination about her outpatient antibiotic therapy. I would plan on 6 weeks of IV antibiotic therapy. Since there is hardware and on the lateral side I would continue the rifampin as well.  Cliffton Asters, MD [facility] [phone] pager   [phone] cell 09/28/2011, 11:50 AM

## 2011-09-28 NOTE — Progress Notes (Signed)
Subjective: 2 Days Post-Op Procedure(s) (LRB): EXTERNAL FIXATION LEG (Left)  Doing well this morning In CPM at current time PICC placed yesterday, pt is sore No acute issues at present time Awaiting final word on home VAC Improvement noted after single dose of diflucan given  Objective: Current Vitals Blood pressure 145/85, pulse 63, temperature 98.5 F (36.9 C), temperature source Oral, resp. rate 18, height 5\' 4"  (1.626 m), weight 68.04 kg (150 lb), SpO2 93.00%. Vital signs in last 24 hours: Temp:  [98.5 F (36.9 C)-99.4 F (37.4 C)] 98.5 F (36.9 C) (01/17 0700) Pulse Rate:  [63-79] 63  (01/17 0700) Resp:  [18] 18  (01/17 0700) BP: (145-158)/(81-85) 145/85 mmHg (01/17 0700) SpO2:  [93 %-97 %] 93 % (01/17 0700)  Intake/Output from previous day: 01/16 0701 - 01/17 0700 In: 1115.3 [I.V.:665.3; IV Piggyback:450] Out: 400 [Urine:400]  LABS  Basename 09/28/11 0600 09/27/11 0535  HGB 12.9 13.4    Basename 09/28/11 0600 09/27/11 0535  WBC 5.6 8.0  RBC 4.08 4.26  HCT 38.3 40.0  PLT 176 177    Basename 09/28/11 0600 09/27/11 0535  NA 137 134*  K 4.0 3.7  CL 104 102  CO2 26 26  BUN 5* 8  CREATININE 0.50 0.52  GLUCOSE 105* 107*  CALCIUM 8.6 8.2*   No results found for this basename: LABPT:2,INR:2 in the last 72 hours  Results for orders placed during the hospital encounter of 09/26/11  GRAM STAIN     Status: Normal   Collection Time   09/26/11 11:13 AM      Component Value Range Status Comment   Specimen Description WOUND LEFT LEG   Final    Special Requests NONE   Final    Gram Stain     Final    Value: FEW WBC PRESENT,BOTH PMN AND MONONUCLEAR     NO ORGANISMS SEEN     Gram Stain Report Called to,Read Back By and Verified With: L. Potter RN 11:45 09/26/11 ([email or web address])   Report Status 09/26/2011 FINAL   Final   WOUND CULTURE     Status: Normal   Collection Time   09/26/11 11:13 AM      Component Value Range Status Comment   Specimen Description WOUND LEFT LEG    Final    Special Requests NONE   Final    Gram Stain     Final    Value: FEW WBC PRESENT,BOTH PMN AND MONONUCLEAR     NO ORGANISMS SEEN     Gram Stain Report Called to,Read Back By and Verified With: Gram Stain Report Called to,Read Back By and Verified With: L POTTER RN 1145 09/26/11 WILSONM Performed at [facility]   Culture     Final    Value: MULTIPLE ORGANISMS PRESENT, NONE PREDOMINANT     Note: NO STAPHYLOCOCCUS AUREUS ISOLATED NO GROUP A STREP (S.PYOGENES) ISOLATED   Report Status 09/28/2011 FINAL   Final   ANAEROBIC CULTURE     Status: Normal (Preliminary result)   Collection Time   09/26/11 11:13 AM      Component Value Range Status Comment   Specimen Description WOUND LEFT LEG   Final    Special Requests NONE   Final    Gram Stain PENDING   Incomplete    Culture     Final    Value: NO ANAEROBES ISOLATED; CULTURE IN PROGRESS FOR 5 DAYS   Report Status PENDING   Incomplete      Physical Exam  Gen:NAD Lungs:Clear Cardiac:S1 and S2 Abd:+ BS Ext: Left Leg  VAC in place  Serosanguinous drainage noted  Dressing stable  Distal motor and sensory functions intact  No changes in exam   Assessment/Plan: 2 Days Post-Op Procedure(s) (LRB): EXTERNAL FIXATION LEG (Left)  41 y/o female s/p ROH L medial proximal tibia secondary to osteomyelitis   1. L tibia osteo s/p ROH   Pt now on IV ancef, cont rifampin due to retained lateral hardware   VAC   Pt will need home vac until medial wound closes, pt at high risk for chronic wound, chronic infection and potentially   amputation if she goes without VAC   Greatly appreciate ID assistance  2. L knee MUA   CPM 3. Nicotine Dependence   Again reviewed what is at stake with the pt regarding overall healing. States she will attempt to give up nicotine  4. Asthma  5. DVT/PE prophylaxis   lovenox while inpatient  6. Activity   WBAT L leg   ROM as tolerated, no restrictions   Total knee precautions  7. Pain   Continue to  encourage PO meds  8. FEN   Hyponatremia improved  Reg diet   vitamin c  9. Vaginal candidiasis   Improved after dose of diflucan, will send home with Rx for diflucan as well 10. Dispo   Awaiting final word on VAC  Plan to d/c home tomorrow  VAC sponge change tomorrow before discharge   Mearl Latin, PA-C 09/28/2011, 8:54 AM

## 2011-09-28 NOTE — Progress Notes (Signed)
OT Cancellation Note  Orders received for OT consult.  Went in and discussed with pt the role of OT.  Pt reports not having any new issues with selfcare tasks or transfers since her original injury was in September.  She has a 3:1 already and reports having almost 24 hour supervison.  Pt also has crutches for mobility but no RW.  Discussed having to carry the vac and pt doesn't seem to feel she will have any trouble.  Will sign off for OT services, no further needs.  Perrin Maltese, OTR/L Pager number [phone] 09/28/2011

## 2011-09-28 NOTE — Progress Notes (Signed)
Utilization review completed. Jolleen Seman, RN, BSN. 09/28/11  

## 2011-09-28 NOTE — Progress Notes (Signed)
CARE MANAGEMENT NOTE 09/28/2011 09/28/11 1200 Vance Peper, RN BSN Case Manager 09/27/11 Paper was faxed to [facility] stating patient is indigent from our patient accounts dept.  Received call stating VAC will be delivered to patient's room today. She will be on IV antibiotics for 6 weeks. PICC has been placed.

## 2011-09-29 DIAGNOSIS — M869 Osteomyelitis, unspecified: Secondary | ICD-10-CM

## 2011-09-29 LAB — VANCOMYCIN, TROUGH: Vancomycin Tr: 14.8 ug/mL (ref 10.0–20.0)

## 2011-09-29 MED ORDER — VANCOMYCIN HCL 1000 MG IV SOLR
1250.0000 mg | Freq: Two times a day (BID) | INTRAVENOUS | Status: DC
  Filled 2011-09-29 (×2): qty 1250

## 2011-09-29 MED ORDER — CALCIUM CARBONATE-VITAMIN D 500-200 MG-UNIT PO TABS: 1.0000 | ORAL_TABLET | Freq: Every day | ORAL | Status: DC

## 2011-09-29 MED ORDER — RIFAMPIN 300 MG PO CAPS: 300.0000 mg | ORAL_CAPSULE | Freq: Two times a day (BID) | ORAL | Status: AC

## 2011-09-29 MED ORDER — OXYCODONE-ACETAMINOPHEN 7.5-325 MG PO TABS: 1.0000 | ORAL_TABLET | Freq: Four times a day (QID) | ORAL | Status: DC | PRN

## 2011-09-29 MED ORDER — ASCORBIC ACID 500 MG PO TABS: 500.0000 mg | ORAL_TABLET | Freq: Every day | ORAL | Status: AC

## 2011-09-29 MED ORDER — HEPARIN SOD (PORK) LOCK FLUSH 100 UNIT/ML IV SOLN
250.0000 [IU] | INTRAVENOUS | Status: AC | PRN
  Administered 2011-09-29: 500 [IU]

## 2011-09-29 MED ORDER — OXYCODONE HCL 5 MG PO TABS: 5.0000 mg | ORAL_TABLET | ORAL | Status: AC | PRN

## 2011-09-29 MED ORDER — VANCOMYCIN HCL 1000 MG IV SOLR: 750.0000 mg | Freq: Two times a day (BID) | INTRAVENOUS | Status: DC

## 2011-09-29 NOTE — Progress Notes (Signed)
ANTIBIOTIC CONSULT NOTE - FOLLOW UP  Pharmacy Consult for Vancomycin Indication: osteomyelitis  Allergies  Allergen Reactions  . Codeine Itching    Patient Measurements: Height: 5\' 4"  (162.6 cm) Weight: 150 lb (68.04 kg) IBW/kg (Calculated) : 54.7    Vital Signs: Temp: 97.9 F (36.6 C) (01/18 0556) Temp src: Oral (01/18 0556) BP: 138/79 mmHg (01/18 0556) Pulse Rate: 64  (01/18 0556) Intake/Output from previous day: 01/17 0701 - 01/18 0700 In: 344 [I.V.:194; IV Piggyback:150] Out: -  Intake/Output from this shift:    Labs:  Basename 09/28/11 0600 09/27/11 0535  WBC 5.6 8.0  HGB 12.9 13.4  PLT 176 177  LABCREA -- --  CREATININE 0.50 0.52   Estimated Creatinine Clearance: 86.8 ml/min (by C-G formula based on Cr of 0.5).  Basename 09/29/11 0300  VANCOTROUGH 14.8  VANCOPEAK --  VANCORANDOM --  GENTTROUGH --  GENTPEAK --  GENTRANDOM --  TOBRATROUGH --  TOBRAPEAK --  TOBRARND --  AMIKACINPEAK --  AMIKACINTROU --  AMIKACIN --     Microbiology: Recent Results (from the past 720 hour(s))  SURGICAL PCR SCREEN     Status: Normal   Collection Time   09/25/11  8:39 AM      Component Value Range Status Comment   MRSA, PCR NEGATIVE  NEGATIVE  Final    Staphylococcus aureus NEGATIVE  NEGATIVE  Final   URINE CULTURE     Status: Normal   Collection Time   09/25/11  8:40 AM      Component Value Range Status Comment   Specimen Description URINE, CLEAN CATCH   Final    Special Requests NONE   Final    Setup Time [id number]   Final    Colony Count NO GROWTH   Final    Culture NO GROWTH   Final    Report Status 09/26/2011 FINAL   Final   GRAM STAIN     Status: Normal   Collection Time   09/26/11 11:13 AM      Component Value Range Status Comment   Specimen Description WOUND LEFT LEG   Final    Special Requests NONE   Final    Gram Stain     Final    Value: FEW WBC PRESENT,BOTH PMN AND MONONUCLEAR     NO ORGANISMS SEEN     Gram Stain Report Called to,Read Back  By and Verified With: L. Potter RN 11:45 09/26/11 ([email or web address])   Report Status 09/26/2011 FINAL   Final   WOUND CULTURE     Status: Normal (Preliminary result)   Collection Time   09/26/11 11:13 AM      Component Value Range Status Comment   Specimen Description WOUND LEFT LEG   Final    Special Requests NONE   Final    Gram Stain     Final    Value: FEW WBC PRESENT,BOTH PMN AND MONONUCLEAR     NO ORGANISMS SEEN     Gram Stain Report Called to,Read Back By and Verified With: Gram Stain Report Called to,Read Back By and Verified With: Olene Craven RN 1145 09/26/11 WILSONM Performed at [facility]   Culture MODERATE DIPHTHEROIDS(CORYNEBACTERIUM SPECIES)   Final    Report Status PENDING   Incomplete   ANAEROBIC CULTURE     Status: Normal (Preliminary result)   Collection Time   09/26/11 11:13 AM      Component Value Range Status Comment   Specimen Description WOUND LEFT LEG   Final  Special Requests NONE   Final    Gram Stain PENDING   Incomplete    Culture     Final    Value: NO ANAEROBES ISOLATED; CULTURE IN PROGRESS FOR 5 DAYS   Report Status PENDING   Incomplete     Anti-infectives     Start     Dose/Rate Route Frequency Ordered Stop   09/29/11 0000   rifampin (RIFADIN) 300 MG capsule        300 mg Oral Every 12 hours 09/29/11 0934 10/09/11 2359   09/29/11 0000   sodium chloride 0.9 % SOLN 150 mL with vancomycin 1000 MG SOLR 750 mg        750 mg 150 mL/hr over 60 Minutes Intravenous Every 12 hours 09/29/11 0934     09/27/11 0930   fluconazole (DIFLUCAN) tablet 150 mg        150 mg Oral  Once 09/27/11 0854 09/27/11 1006   09/26/11 1915   vancomycin (VANCOCIN) 750 mg in sodium chloride 0.9 % 150 mL IVPB        750 mg 150 mL/hr over 60 Minutes Intravenous Every 8 hours 09/26/11 1457     09/26/11 1530   rifampin (RIFADIN) capsule 300 mg        300 mg Oral Every 12 hours 09/26/11 1343     09/26/11 0000   vancomycin (VANCOCIN) IVPB 1000 mg/200 mL premix     Comments: DO NOT  GIVE IN SHORT STAY OR HOLDING AREA, WILL GIVE INTRA-OPERATIVELY ONCE CULTURES OBTAINED      1,000 mg 200 mL/hr over 60 Minutes Intravenous Intra-op PRN 09/25/11 1518 09/26/11 1115          Assessment: 41 yo F with osteomyelitis left tibia s/p hardware removal at site.  Currently on day#4 Vancomycin 750mg  IV q8h with trough level 14.8 today + po Rifampin.  Renal function has been stable & pt afebrile.  Culture +CNS on gram stain.  Plan to discharge home today with IV Vancomycin and po Rifamipin for 6wk course.  Goal of Therapy:  Vancomycin trough level 15-20 mcg/ml  Plan:  1) Vancomycin at low end of goal range.  Would recommend change to Vanc 1250mg  IV q12h if q8hr dosing not practical for outpt treatment.   2) Check weekly vanc trough level and scr while on vancomycin  [name], [name] 09/29/2011,9:51 AM

## 2011-09-29 NOTE — Progress Notes (Addendum)
INFECTIOUS DISEASE PROGRESS NOTE  ID: [name] is a 41 y.o. female with tibial plateau fracture and chronic osteo near [facility] fixation. Bone cultures +CoNS and diphtheroids.  Subjective: Afebrile, looking forward to going home.  Abtx: vanco rif     . calcium-vitamin D  1 tablet Oral Daily  . docusate sodium  100 mg Oral BID  . enoxaparin  40 mg Subcutaneous Q24H  . pantoprazole  40 mg Oral Q1200  . rifampin  300 mg Oral Q12H  . vancomycin  1,250 mg Intravenous Q12H  . vitamin C  500 mg Oral Daily  . DISCONTD: vancomycin  750 mg Intravenous Q8H    Objective: Vital signs in last 24 hours: Temp:  [97.9 F (36.6 C)-98.5 F (36.9 C)] 97.9 F (36.6 C) (01/18 0556) Pulse Rate:  [64-74] 64  (01/18 0556) Resp:  [18] 18  (01/18 0556) BP: (138-157)/(78-81) 138/79 mmHg (01/18 0556) SpO2:  [96 %-98 %] 96 % (01/18 0556)  General: resting in bed, NAD, A x O by 3 HEENT: PERRL, EOMI, no scleral icterus  Cardiac: RRR, no rubs, murmurs or gallops  Pulm: clear to auscultation bilaterally, no wheezes, rales, or rhonchi  Abd: soft, nontender, nondistended, BS present  Ext: ACE wound vac in place on left leg. Good pulses in foot and sensation intact. warm and well perfused, mild pedal edema on left leg.     Lab Results  Basename 09/28/11 0600 09/27/11 0535  WBC 5.6 8.0  HGB 12.9 13.4  HCT 38.3 40.0  NA 137 134*  K 4.0 3.7  CL 104 102  CO2 26 26  BUN 5* 8  CREATININE 0.50 0.52  GLU -- --   Liver Panel  Basename 09/27/11 0535  PROT 5.5*  ALBUMIN 2.8*  AST 14  ALT 8  ALKPHOS 74  BILITOT 1.4*  BILIDIR 0.5*  IBILI 0.9   Sedimentation Rate No results found for this basename: ESRSEDRATE in the last 72 hours C-Reactive Protein No results found for this basename: CRP:2 in the last 72 hours  Microbiology: Recent Results (from the past 240 hour(s))  SURGICAL PCR SCREEN     Status: Normal   Collection Time   09/25/11  8:39 AM      Component Value Range Status Comment   MRSA, PCR NEGATIVE  NEGATIVE  Final    Staphylococcus aureus NEGATIVE  NEGATIVE  Final   URINE CULTURE     Status: Normal   Collection Time   09/25/11  8:40 AM      Component Value Range Status Comment   Specimen Description URINE, CLEAN CATCH   Final    Special Requests NONE   Final    Setup Time [id number]   Final    Colony Count NO GROWTH   Final    Culture NO GROWTH   Final    Report Status 09/26/2011 FINAL   Final   GRAM STAIN     Status: Normal   Collection Time   09/26/11 11:13 AM      Component Value Range Status Comment   Specimen Description WOUND LEFT LEG   Final    Special Requests NONE   Final    Gram Stain     Final    Value: FEW WBC PRESENT,BOTH PMN AND MONONUCLEAR     NO ORGANISMS SEEN     Gram Stain Report Called to,Read Back By and Verified With: L. Potter RN 11:45 09/26/11 ([email or web address])   Report Status 09/26/2011 FINAL   Final  WOUND CULTURE     Status: Normal (Preliminary result)   Collection Time   09/26/11 11:13 AM      Component Value Range Status Comment   Specimen Description WOUND LEFT LEG   Final    Special Requests NONE   Final    Gram Stain     Final    Value: FEW WBC PRESENT,BOTH PMN AND MONONUCLEAR     NO ORGANISMS SEEN     Gram Stain Report Called to,Read Back By and Verified With: Gram Stain Report Called to,Read Back By and Verified With: L POTTER RN 1145 09/26/11 WILSONM Performed at [facility]   Culture     Final    Value: FEW STAPHYLOCOCCUS SPECIES (COAGULASE NEGATIVE)     Note: RIFAMPIN AND GENTAMICIN SHOULD NOT BE USED AS SINGLE DRUGS FOR TREATMENT OF STAPH INFECTIONS.     MODERATE DIPHTHEROIDS(CORYNEBACTERIUM SPECIES)     Note: Standardized susceptibility testing for this organism is not available.   Report Status PENDING   Incomplete   ANAEROBIC CULTURE     Status: Normal (Preliminary result)   Collection Time   09/26/11 11:13 AM      Component Value Range Status Comment   Specimen Description WOUND LEFT LEG   Final    Special  Requests NONE   Final    Gram Stain PENDING   Incomplete    Culture     Final    Value: NO ANAEROBES ISOLATED; CULTURE IN PROGRESS FOR 5 DAYS   Report Status PENDING   Incomplete     Studies/Results: No results found.   Assessment/Plan: 41 yo F with osteomyelitis left tibia s/p hardware removal at site. Currently on day#4 Vancomycin 750mg  IV q8h and rifampin 300mg  BID with trough level 14.8 today.. Renal function has been stable & pt afebrile. Cultures showing corynebacterium, sensitivities pending.   for osteomyelitis with hardware involvement, will plan for  IV Vancomycin 1250mg  Q12 and po Rifampin 300mg  BID for 6wk course.  Home health needs weekly bmp, vancomycin trough. First trough should be this Sunday.  Patient to be seen in ID clinic in 4-6 wks with Dr. Cliffton Asters. We will arrange for follow up appt  Chery Giusto B. Drue Second MD MPH [facility] [phone]  If questions this weekend, please contact Dr. Gwen Her [facility]    09/29/2011, 1:57 PM

## 2011-09-29 NOTE — Progress Notes (Signed)
Subjective: 3 Days Post-Op Procedure(s) (LRB): EXTERNAL FIXATION LEG (Left) Doing very well No complaints  Wants to go home Using CPM daily Denies chills, sweats, no fevers Tolerating diet Has home VAC  Objective: Current Vitals Blood pressure 138/79, pulse 64, temperature 97.9 F (36.6 C), temperature source Oral, resp. rate 18, height 5\' 4"  (1.626 m), weight 68.04 kg (150 lb), SpO2 96.00%. Vital signs in last 24 hours: Temp:  [97.9 F (36.6 C)-98.5 F (36.9 C)] 97.9 F (36.6 C) (01/18 0556) Pulse Rate:  [64-74] 64  (01/18 0556) Resp:  [18] 18  (01/18 0556) BP: (138-157)/(78-81) 138/79 mmHg (01/18 0556) SpO2:  [96 %-98 %] 96 % (01/18 0556)  Intake/Output from previous day: 01/17 0701 - 01/18 0700 In: 344 [I.V.:194; IV Piggyback:150] Out: -   LABS  Basename 09/28/11 0600 09/27/11 0535  HGB 12.9 13.4    Basename 09/28/11 0600 09/27/11 0535  WBC 5.6 8.0  RBC 4.08 4.26  HCT 38.3 40.0  PLT 176 177    Basename 09/28/11 0600 09/27/11 0535  NA 137 134*  K 4.0 3.7  CL 104 102  CO2 26 26  BUN 5* 8  CREATININE 0.50 0.52  GLUCOSE 105* 107*  CALCIUM 8.6 8.2*   No results found for this basename: LABPT:2,INR:2 in the last 72 hours  Results for orders placed during the hospital encounter of 09/26/11  GRAM STAIN     Status: Normal   Collection Time   09/26/11 11:13 AM      Component Value Range Status Comment   Specimen Description WOUND LEFT LEG   Final    Special Requests NONE   Final    Gram Stain     Final    Value: FEW WBC PRESENT,BOTH PMN AND MONONUCLEAR     NO ORGANISMS SEEN     Gram Stain Report Called to,Read Back By and Verified With: L. Potter RN 11:45 09/26/11 ([email or web address])   Report Status 09/26/2011 FINAL   Final   WOUND CULTURE     Status: Normal (Preliminary result)   Collection Time   09/26/11 11:13 AM      Component Value Range Status Comment   Specimen Description WOUND LEFT LEG   Final    Special Requests NONE   Final    Gram Stain     Final    Value: FEW WBC PRESENT,BOTH PMN AND MONONUCLEAR     NO ORGANISMS SEEN     Gram Stain Report Called to,Read Back By and Verified With: Gram Stain Report Called to,Read Back By and Verified With: L POTTER RN 1145 09/26/11 WILSONM Performed at [facility]   Culture MODERATE DIPHTHEROIDS(CORYNEBACTERIUM SPECIES)   Final    Report Status PENDING   Incomplete   ANAEROBIC CULTURE     Status: Normal (Preliminary result)   Collection Time   09/26/11 11:13 AM      Component Value Range Status Comment   Specimen Description WOUND LEFT LEG   Final    Special Requests NONE   Final    Gram Stain PENDING   Incomplete    Culture     Final    Value: NO ANAEROBES ISOLATED; CULTURE IN PROGRESS FOR 5 DAYS   Report Status PENDING   Incomplete      Physical Exam  Gen:NAD, in CPM Lungs:clear Cardiac:reg Abd:+BS Ext: Left Lower Extremity   Ordered vac sponge to be in room yesterday, it is not, will have WOC nurse change dressing before discharge  Swelling very well  controlled   Distal motor and sensory function intact  cpm 0-90  Ext is warm with + DP pulse   Imaging No results found.  Assessment/Plan: 3 Days Post-Op Procedure(s) (LRB): EXTERNAL FIXATION LEG (Left)  41 y/o female s/p ROH L medial proximal tibia secondary to osteomyelitis   1. L tibia osteo s/p ROH   Pt cont on vanc secondary to cultures, cont rifampin due to retained lateral hardware   VAC   Pt will need home vac until medial wound closes, pt at high risk for chronic wound, chronic infection and potentially amputation if she goes without VAC   Greatly appreciate ID assistance   Diphtheroids on culture, defer final abx selection to ID 2. L knee MUA   CPM  3. Nicotine Dependence   Again reviewed what is at stake with the pt regarding overall healing. States she will attempt to give up nicotine  4. Asthma  5. DVT/PE prophylaxis    lovenox while inpatient  6. Activity   WBAT L leg ROM as tolerated, no restrictions    Total knee precautions  7. Pain   Continue to encourage PO meds  8. FEN   Hyponatremia improved   Reg diet   vitamin c  9. Vaginal candidiasis   Improved after dose of diflucan, will send home with Rx for diflucan as well  10. Dispo   Has home VAC  D/c home today  Vac sponge change by wound care nurse before discharge   Mearl Latin, PA-C 09/29/2011, 9:19 AM

## 2011-09-29 NOTE — Consult Note (Signed)
WOC consult Note Reason for Consult: first post op VAC dressing, hook to home VAC unit for dc to home Wound type:surgical Wound bed: L medial surgical leg wound, closed with sutures with open area midpoint of incision, clean with granulation tissue present, bone visible in wound base Drainage (amount, consistency, odor) minimal in VAC canister since placement in OR Tuesday Periwound:some minimal maceration around open area Dressing procedure/placement/frequency:applied Mepitel to closed incision over sutures, 1pc black foam cut to fit into wound bed, 2nd pc black foam cut in strip to fit over Mepitel and placed in contact with sponge in open wound, suction applied at , seal obtained without problems.  Pt tolerated well. Hooked up to home [facility] unit, verified setting at and reminded pt to plug unit up once at home.    HHRN to see pt for further teaching, VAC changes  Explained use of home VAC unit to pt and husband at bedside.    Re consult if needed, will not follow at this time. Thanks  Maloree Uplinger [employer], CWOCN [phone]) Conservative sharp wound debridement (CSWD performed at the bedside:

## 2011-10-03 ENCOUNTER — Encounter (HOSPITAL_COMMUNITY): Payer: Self-pay | Admitting: Orthopedic Surgery

## 2011-10-04 ENCOUNTER — Other Ambulatory Visit (HOSPITAL_COMMUNITY): Payer: Self-pay | Admitting: Orthopedic Surgery

## 2011-10-04 ENCOUNTER — Ambulatory Visit (HOSPITAL_COMMUNITY)
Admission: RE | Admit: 2011-10-04 | Discharge: 2011-10-04 | Disposition: A | Discharge status: Home / Self Care | Payer: Self-pay | Source: Ambulatory Visit | Attending: Orthopedic Surgery | Admitting: Orthopedic Surgery

## 2011-10-04 DIAGNOSIS — Y849 Medical procedure, unspecified as the cause of abnormal reaction of the patient, or of later complication, without mention of misadventure at the time of the procedure: Secondary | ICD-10-CM | POA: Insufficient documentation

## 2011-10-04 DIAGNOSIS — T85898A Other specified complication of other internal prosthetic devices, implants and grafts, initial encounter: Secondary | ICD-10-CM | POA: Insufficient documentation

## 2011-10-04 DIAGNOSIS — M869 Osteomyelitis, unspecified: Secondary | ICD-10-CM

## 2011-10-04 MED ORDER — CHLORHEXIDINE GLUCONATE 4 % EX LIQD
CUTANEOUS | Status: AC
  Filled 2011-10-04: qty 15

## 2011-10-04 NOTE — Procedures (Signed)
R PICC exchange 38cm SL to SVC/RA jct No complication No blood loss. See complete dictation in [facility].

## 2011-10-05 ENCOUNTER — Telehealth (HOSPITAL_COMMUNITY): Payer: Self-pay

## 2011-10-26 ENCOUNTER — Encounter: Payer: Self-pay | Admitting: Internal Medicine

## 2011-10-31 ENCOUNTER — Ambulatory Visit (INDEPENDENT_AMBULATORY_CARE_PROVIDER_SITE_OTHER): Payer: Self-pay | Admitting: Internal Medicine

## 2011-10-31 ENCOUNTER — Encounter: Payer: Self-pay | Admitting: Internal Medicine

## 2011-10-31 VITALS — BP 190/110 | HR 86 | Temp 98.5°F | Ht 64.0 in | Wt 159.5 lb

## 2011-10-31 DIAGNOSIS — M869 Osteomyelitis, unspecified: Secondary | ICD-10-CM

## 2011-10-31 NOTE — Progress Notes (Addendum)
Patient ID: MIYAKO OELKE, female   DOB: [birth year], 41 y.o.   MRN: [id number]  INFECTIOUS DISEASE PROGRESS NOTE  Day 35 antibiotics   Subjective: Mrs. Bessire is in for her hospital followup visit. She fell off her front porch in September suffering a tibial plateau fracture on her left side. She underwent open reduction and internal fixation with lateral and medial hardware. The medial incision never healed completely. She had partial responses to multiple courses of oral antibiotics but eventually required readmission to the hospital last month for removal of the medial plate. The sinus tract with this noted tracking down to the plate. Soft bone was Dupree did an operative cultures grew a methicillin-resistant coagulase-negative staph and a diphtheroid species. She was discharged home with a PICC on IV vancomycin and oral rifampin. She has tolerated the PIC and antibiotics reasonably well. She is now completed 5 weeks of total antibiotic therapy  Objective: Temp: 98.5 F (36.9 C) (02/19 1401) Temp src: Oral (02/19 1401) BP: 190/110 mmHg (02/19 1401) Pulse Rate: 86  (02/19 1401)  General: She is on crutches Skin: Her right arm PICC site is normal Lungs: Clear Cor: Regular S1 and S2 no murmurs She has a VAC dressing on her left medial knee incision. The wound appears to be much smaller than upon discharge from the hospital.  Lab Results Lab Results  Component Value Date   CRP 0.45* 09/21/2011    Lab Results  Component Value Date   ESRSEDRATE 1 09/21/2011      Assessment: Mrs. Pine has postoperative wound infection and osteomyelitis near the tibial fracture site and the area of previous bone grafting. Interestingly enough, her sedimentation rate and C-reactive protein were not elevated while she was hospitalized so they will not be critically useful for determining optimal duration of antibiotic therapy. I would like to complete at least one more week of antibiotics but will speak with  Dr. Myrene Galas, her orthopedic surgeon, tomorrow after he sees her in followup.  Plan: 1. Continue antibiotics at least one more week 2. Discussed situation with Dr. Carola Frost 3. Return to clinic in one month   Cliffton Asters, MD [facility] for Infectious Diseases [facility] [phone] pager   [phone] cell 10/31/2011, 2:19 PM  Addendum: I spoke with Dr. Carola Frost today. He agreed that she is improving slowly. We have decided that it is best to try to continue current antibiotic therapy until her wound has closed. I will plan on seeing her back in 3 weeks which will be at the end of 8 weeks of total therapy. I spoke with Mrs. Quizhpi today and she is in agreement with that plan.

## 2011-11-01 ENCOUNTER — Encounter: Payer: Self-pay | Admitting: Internal Medicine

## 2011-11-14 NOTE — Discharge Summary (Signed)
Physician Discharge Summary  Patient ID: DEVINE DANT MRN: [id number] DOB/AGE: August 12, 1971 41 y.o.  Admit date: 09/26/2011 Discharge date: 09/29/2011  Admission Diagnoses: Osteomyelitis L tibia Tobacco dependence  Discharge Diagnoses:  Principal Problem:  *Osteomyelitis of left leg Active Problems:  TOBACCO DEPENDENCE  Asthma  Tibial plateau fracture Candidiasis   Procedure: I&D L leg with removal of hardware from medial proximal tibia  Discharged Condition: stable  Hospital Course:   41 y/o female s/p repair of complex bicondylar L tibial plateau fracture.  Continued to smoke throughout post op period and had wound problems with medial wound.  It was determined that she had osteo and needed debridement and plate removal.  Pt was admitted on 09/26/2011 and surgery was performed same day as well.  Medial plate was removed and due to wound issues a VAC device was placed on the wound to facilitate healing via negative pressure.  Pts hospital stay was relatively uncomplicated.  Pt was started initially on empiric vancomycin while cultures were pending as well as rifampin as her lateral hardware was retained. Eventual cultures showed corynebacterium, therefore the pt was continued on vancomycin and rifampin at discharge.  On postop day # 3 pt was deemed stable for discharge as all DME had been arranged, VAC treatments and home VAC unit were arranged and [facility] were [facility].    Consults: ID  Significant Diagnostic Studies: microbiology: wound culture: positive for corynebacterium  Treatments: IV hydration, antibiotics: vancomycin and rifampin, analgesia: percocet, anticoagulation: LMW heparin, therapies: PT and RN and surgery: as above  Discharge Exam:  Subjective:  3 Days Post-Op Procedure(s) (LRB):  EXTERNAL FIXATION LEG (Left)  Doing very well  No complaints  Wants to go home  Using CPM daily  Denies chills, sweats, no fevers  Tolerating diet  Has home VAC    Objective:  Current Vitals  Blood pressure 138/79, pulse 64, temperature 97.9 F (36.6 C), temperature source Oral, resp. rate 18, height 5\' 4"  (1.626 m), weight 68.04 kg (150 lb), SpO2 96.00%.  Vital signs in last 24 hours:  Temp: [97.9 F (36.6 C)-98.5 F (36.9 C)] 97.9 F (36.6 C) (01/18 0556)  Pulse Rate: [64-74] 64 (01/18 0556)  Resp: [18] 18 (01/18 0556)  BP: (138-157)/(78-81) 138/79 mmHg (01/18 0556)  SpO2: [96 %-98 %] 96 % (01/18 0556)  Intake/Output from previous day:  01/17 0701 - 01/18 0700  In: 344 [I.V.:194; IV Piggyback:150]  Out: -  LABS   Basename  09/28/11 0600  09/27/11 0535   HGB  12.9  13.4     Basename  09/28/11 0600  09/27/11 0535   WBC  5.6  8.0   RBC  4.08  4.26   HCT  38.3  40.0   PLT  176  177     Basename  09/28/11 0600  09/27/11 0535   NA  137  134*   K  4.0  3.7   CL  104  102   CO2  26  26   BUN  5*  8   CREATININE  0.50  0.52   GLUCOSE  105*  107*   CALCIUM  8.6  8.2*    No results found for this basename: LABPT:2,INR:2 in the last 72 hours  Results for orders placed during the hospital encounter of 09/26/11   GRAM STAIN Status: Normal    Collection Time    09/26/11 11:13 AM   Component  Value  Range  Status  Comment  Specimen Description  WOUND LEFT LEG   Final     Special Requests  NONE   Final     Gram Stain    Final     Value:  FEW WBC PRESENT,BOTH PMN AND MONONUCLEAR     NO ORGANISMS SEEN     Gram Stain Report Called to,Read Back By and Verified With: L. Potter RN 11:45 09/26/11 ([email or web address])    Report Status  09/26/2011 FINAL   Final    WOUND CULTURE Status: Normal (Preliminary result)    Collection Time    09/26/11 11:13 AM   Component  Value  Range  Status  Comment    Specimen Description  WOUND LEFT LEG   Final     Special Requests  NONE   Final     Gram Stain    Final     Value:  FEW WBC PRESENT,BOTH PMN AND MONONUCLEAR     NO ORGANISMS SEEN     Gram Stain Report Called to,Read Back By and Verified With: Gram Stain  Report Called to,Read Back By and Verified With: L POTTER RN 1145 09/26/11 WILSONM Performed at [facility]    Culture  MODERATE DIPHTHEROIDS(CORYNEBACTERIUM SPECIES)   Final     Report Status  PENDING   Incomplete    ANAEROBIC CULTURE Status: Normal (Preliminary result)    Collection Time    09/26/11 11:13 AM   Component  Value  Range  Status  Comment    Specimen Description  WOUND LEFT LEG   Final     Special Requests  NONE   Final     Gram Stain  PENDING   Incomplete     Culture    Final     Value:  NO ANAEROBES ISOLATED; CULTURE IN PROGRESS FOR 5 DAYS    Report Status  PENDING   Incomplete     Physical Exam  Gen:NAD, in CPM  Lungs:clear  Cardiac:reg  Abd:+BS  Ext: Left Lower Extremity  Ordered vac sponge to be in room yesterday, it is not, will have WOC nurse change dressing before discharge  Swelling very well controlled  Distal motor and sensory function intact  cpm 0-90  Ext is warm with + DP pulse  Imaging  No results found.  Assessment/Plan:  3 Days Post-Op Procedure(s) (LRB):  EXTERNAL FIXATION LEG (Left)  41 y/o female s/p ROH L medial proximal tibia secondary to osteomyelitis  1. L tibia osteo s/p ROH  Pt cont on vanc secondary to cultures, cont rifampin due to retained lateral hardware  VAC  Pt will need home vac until medial wound closes, pt at high risk for chronic wound, chronic infection and potentially amputation if she goes without VAC  Greatly appreciate ID assistance  Diphtheroids on culture, defer final abx selection to ID  2. L knee MUA  CPM  3. Nicotine Dependence  Again reviewed what is at stake with the pt regarding overall healing. States she will attempt to give up nicotine  4. Asthma  5. DVT/PE prophylaxis  lovenox while inpatient  6. Activity  WBAT L leg ROM as tolerated, no restrictions  Total knee precautions  7. Pain  Continue to encourage PO meds  8. FEN  Hyponatremia improved  Reg diet  vitamin c  9. Vaginal candidiasis    Improved after dose of diflucan, will send home with Rx for diflucan as well  10. Dispo  Has home VAC  D/c home today  Vac sponge change by wound  care nurse before discharge      Disposition: Home-Health Care Svc  Discharge Orders    Future Appointments: Provider: Department: Dept Phone: Center:   11/28/2011 10:45 AM Cliffton Asters, MD [facility] [phone] [facility]     Medication List  As of 11/14/2011  4:30 PM   STOP taking these medications         ibuprofen 200 MG tablet         TAKE these medications         albuterol 108 (90 BASE) MCG/ACT inhaler   Commonly known as: PROVENTIL HFA;VENTOLIN HFA   Inhale 2 puffs into the lungs every 6 (six) hours as needed.      ascorbic acid 500 MG tablet   Commonly known as: VITAMIN C   Take 1 tablet (500 mg total) by mouth daily.      CALTRATE 600+D 600-400 MG-UNIT per tablet   Generic drug: Calcium Carbonate-Vitamin D   Take 1 tablet by mouth daily.      oxyCODONE-acetaminophen 7.5-325 MG per tablet   Commonly known as: PERCOCET   Take 1-2 tablets by mouth every 6 (six) hours as needed. For pain      sodium chloride 0.9 % SOLN 150 mL with vancomycin 1000 MG SOLR 750 mg   Inject 750 mg into the vein every 12 (twelve) hours. Inject 750 mg into the vein every 8 (eight) hours.           Rifampin 300 mg po bid  Vancomycin per pharmacy dosing   Follow-up Information    Follow up with Budd Palmer, MD on 10/04/2011. (call for appointment)    Contact information:   [address] [place] [address] [phone]       Follow up with Cliffton Asters, MD in 4 weeks. (call for appointment)    Contact information:   [address] [place] [address] [phone]          Signed: Mearl Latin 11/14/2011, 4:30 PM

## 2011-11-28 ENCOUNTER — Ambulatory Visit (INDEPENDENT_AMBULATORY_CARE_PROVIDER_SITE_OTHER): Payer: Self-pay | Admitting: Internal Medicine

## 2011-11-28 ENCOUNTER — Encounter: Payer: Self-pay | Admitting: Internal Medicine

## 2011-11-28 VITALS — BP 180/96 | HR 84 | Temp 98.3°F | Ht 64.0 in | Wt 150.5 lb

## 2011-11-28 DIAGNOSIS — M869 Osteomyelitis, unspecified: Secondary | ICD-10-CM

## 2011-11-28 NOTE — Progress Notes (Signed)
Patient ID: [name], female   DOB: [birth year], 41 y.o.   MRN: [id number]  INFECTIOUS DISEASE PROGRESS NOTE    Subjective: [name] is in for her routine followup visit. She is now completed 64 days of IV vancomycin and oral rifampin for her MRSA tibial osteomyelitis. She continues to have some problems with nausea, anorexia and mild diarrhea. She notes that they have not been able to draw any blood from the PICC line for the last month and her vancomycin infuses very very slowly. She has some irritation at the PICC site but otherwise has tolerated it well.  Objective: Temp: 98.3 F (36.8 C) (03/19 1049) Temp src: Oral (03/19 1049) BP: 180/96 mmHg (03/19 1049) Pulse Rate: 84  (03/19 1049)  General: She is in good spirits Skin: The PICC site looks normal She continues to have one small area of her surgical incision that remains open. It is very superficial and there is just a scant amount of yellow serous drainage on her gauze dressing. There is no tenderness or surrounding cellulitis.  Lab Results Lab Results  Component Value Date   CRP 0.45* 09/21/2011    Lab Results  Component Value Date   ESRSEDRATE 1 09/21/2011      Assessment: She's now had over 2 months of therapy for her osteomyelitis. She is having some minor intolerance of her antibiotics. I think it is time to stop and observe off of antibiotic therapy.  Plan: 1. Discontinue vancomycin and rifampin and observe off antibiotic therapy 2. Removed PICC 3. Return to clinic in 6 weeks   Cliffton Asters, MD [facility] for Infectious Diseases [facility] [phone] pager   [phone] cell 11/28/2011, 12:15 PM

## 2011-11-28 NOTE — Progress Notes (Signed)
Order to remove PICC line obtained from Dr. Orvan Falconer. Pt. identified with name and date of birth. PICC dressing removed, site unremarkable. PICC line removed using sterile procedure @ 1115 pm. PICC length equal to that noted in pt's hospital chart of 38 cm. Sterile petroleum gauze + sterile 4X4 applied to PICC site, pressure applied for 10 minutes and covered with Medipore tape as a pressure dressing. Pt. instructed to limit use of arm for 1 hour. Pt. instructed that the pressure dressing should remain in place for 24 hours. Pt. verablized understanding of these instructions.  Phone call to [facility]  PICC line removal.  Wet-to-dry dressing placed on left shine per Dr. Orvan Falconer

## 2011-12-06 ENCOUNTER — Encounter: Payer: Self-pay | Admitting: Internal Medicine

## 2012-01-09 ENCOUNTER — Ambulatory Visit: Payer: Self-pay | Admitting: Internal Medicine

## 2012-01-30 ENCOUNTER — Ambulatory Visit (INDEPENDENT_AMBULATORY_CARE_PROVIDER_SITE_OTHER): Payer: Self-pay | Admitting: Internal Medicine

## 2012-01-30 ENCOUNTER — Encounter: Payer: Self-pay | Admitting: Internal Medicine

## 2012-01-30 VITALS — BP 164/110 | HR 91 | Temp 98.5°F | Ht 64.0 in | Wt 149.5 lb

## 2012-01-30 DIAGNOSIS — R799 Abnormal finding of blood chemistry, unspecified: Secondary | ICD-10-CM

## 2012-01-30 DIAGNOSIS — M869 Osteomyelitis, unspecified: Secondary | ICD-10-CM

## 2012-01-30 NOTE — Progress Notes (Signed)
Patient ID: [name], female   DOB: 11/17/1970, 41 y.o.   MRN: [id number]  INFECTIOUS DISEASE PROGRESS NOTE    Patient Active Problem List  Diagnoses  . TOBACCO DEPENDENCE  . RHINITIS, ALLERGIC  . SCOLIOSIS  . Osteomyelitis of left leg  . Asthma  . Tibial plateau fracture     Patient's Medications  New Prescriptions   No medications on file  Previous Medications   ALBUTEROL (PROVENTIL HFA;VENTOLIN HFA) 108 (90 BASE) MCG/ACT INHALER    Inhale 2 puffs into the lungs every 6 (six) hours as needed.   CALCIUM CARBONATE-VITAMIN D (CALTRATE 600+D) 600-400 MG-UNIT PER TABLET    Take 1 tablet by mouth daily.   OXYCODONE-ACETAMINOPHEN (PERCOCET) 7.5-325 MG PER TABLET    Take 1-2 tablets by mouth every 6 (six) hours as needed. For pain   VITAMIN C (VITAMIN C) 500 MG TABLET    Take 1 tablet (500 mg total) by mouth daily.  Modified Medications   No medications on file  Discontinued Medications   No medications on file    Subjective: [name] is in for her routine followup visit. 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 She's been off of all antibiotics for 2 months. She is feeling much better with minimal pain in her left leg. She did have diarrhea while on the antibiotics and it resolved slowly over 3 weeks after stopping them. She does not currently have a primary care physician or health insurance.  Objective: Temp: 98.5 F (36.9 C) (05/21 0931) Temp src: Oral (05/21 0931) BP: 164/110 mmHg (05/21 0931) Pulse Rate: 91  (05/21 0931)  General: No distress Skin: No rash Lungs: Clear Cor: Regular S1 and S2 with no murmurs Left leg: Her surgical incision is healed without any evidence for residual infection.  Lab Results Lab Results  Component Value Date   CRP 0.45* 09/21/2011    Lab Results  Component Value Date   ESRSEDRATE 1 09/21/2011      Assessment: Her tibial osteomyelitis has been cured. I  will continue observation off of antibiotics. She has hypertension and continues to smoke cigarettes. He had given her information about obtaining ongoing primary medical care through [facility] clinic.  Plan: 1. Continue observation off of antibiotics 2. Establish primary medical care   Deborah Asters, MD [facility] [phone] pager   [phone] cell 01/30/2012, 9:45 AM

## 2013-02-01 IMAGING — CR DG KNEE 1-2V PORT*L*
2 series · 2 of 2 positions shown · non-contrast
Comparison: Intraoperative radiographs dated 05/16/2011

CLINICAL DATA: Postop left knee fixation

PORTABLE LEFT KNEE - 1-2 VIEW

[view not recorded (1 of 2)]
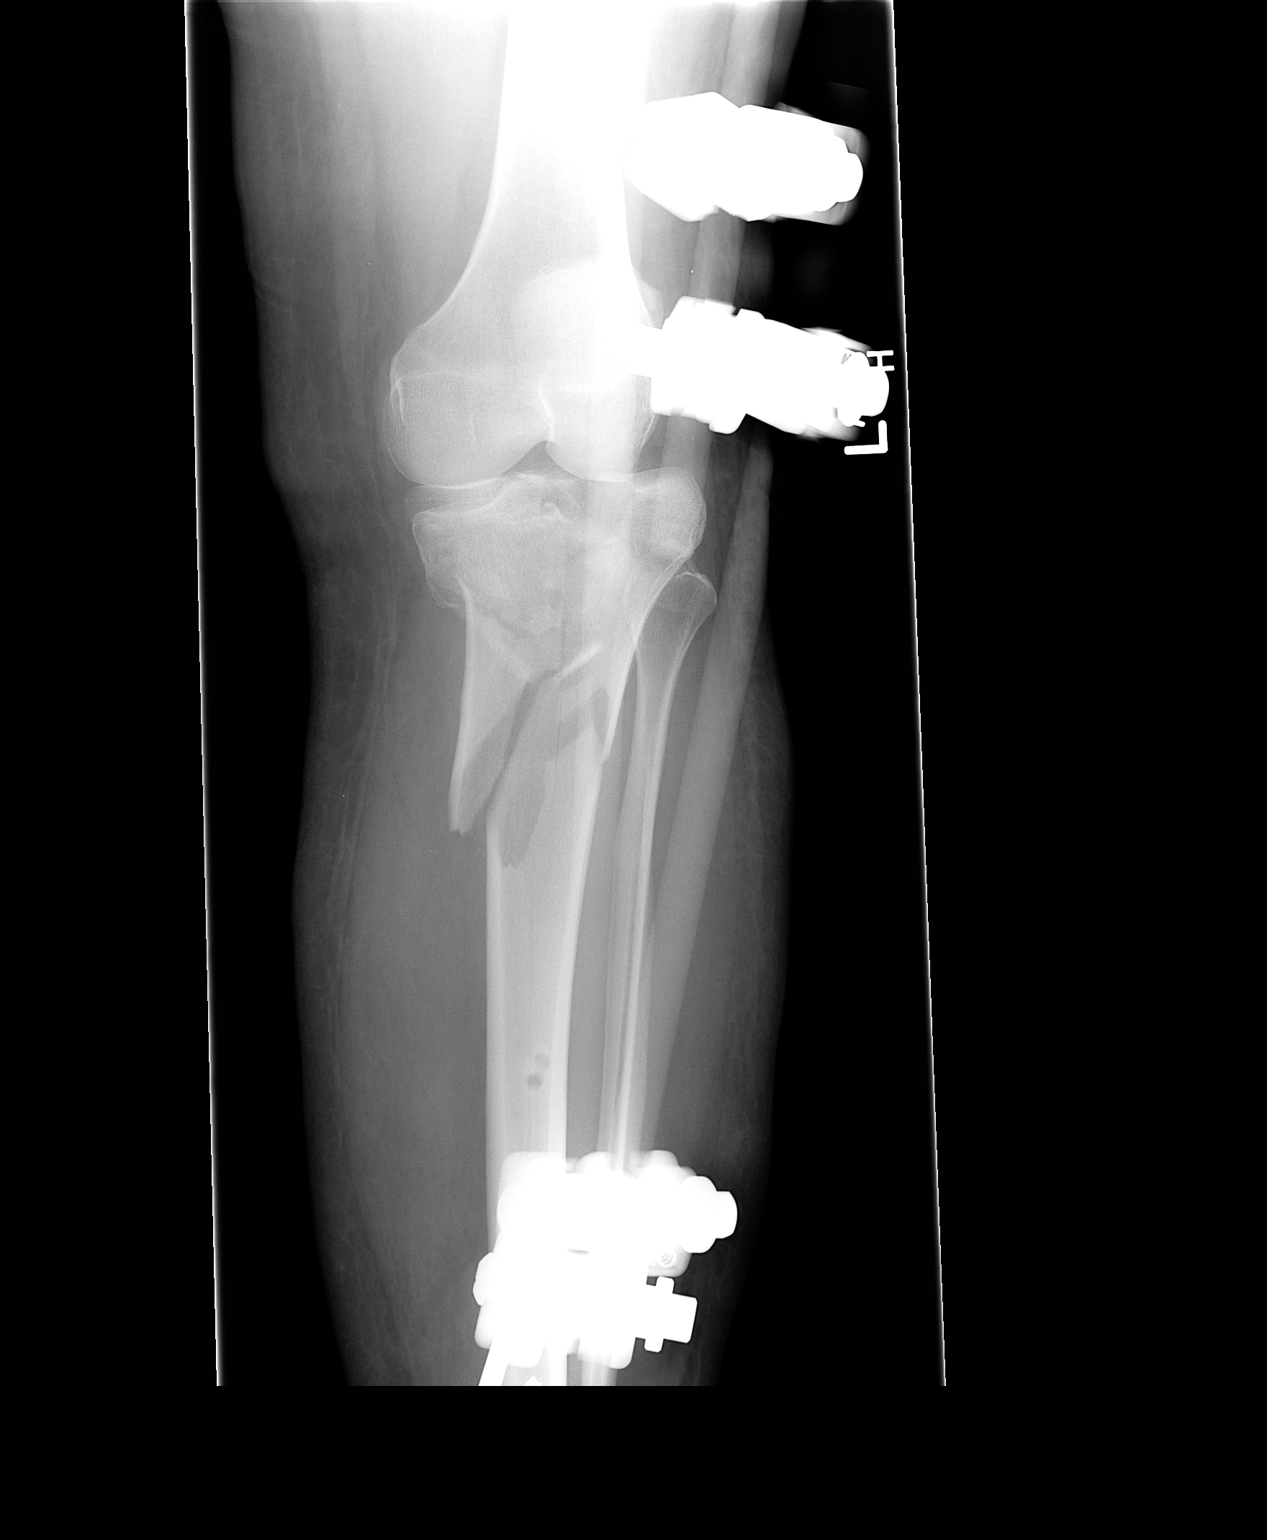

[view not recorded (2 of 2)]
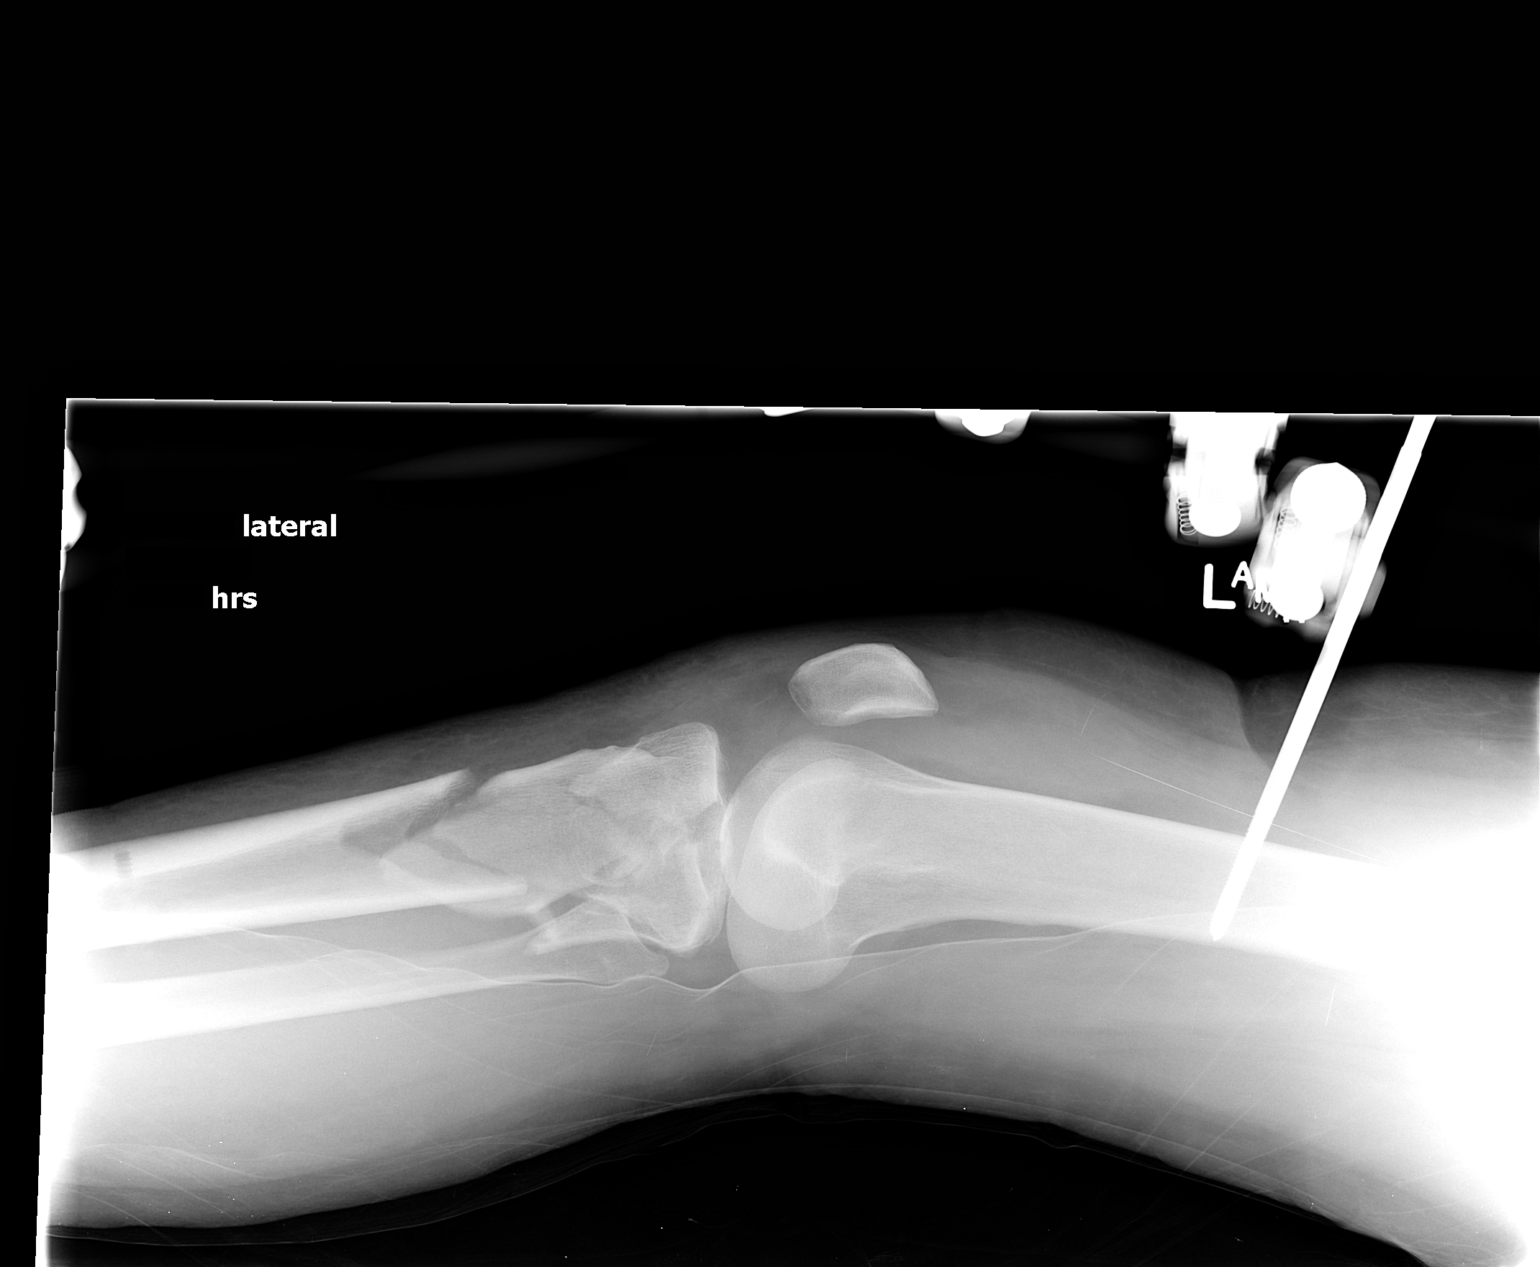

[2 of 2 positions shown; findings below may reference images not displayed]

FINDINGS: Status post external fixation of a comminuted proximal
tibial fracture extending to the articular surface. No evidence of
hardware complication.

Ghost holes in the mid tibial shaft from prior fixator device
IMPRESSION: Status post external fixation of a comminuted proximal tibial
fracture.

## 2013-02-01 IMAGING — CT CT KNEE*L* W/O CM
3 series · 15 of 27 positions shown, 18 images · non-contrast
Comparison: CT left tib fib 05/13/2011 and plain films 05/16/2011
and 05/13/2011.
COMPARISON: CT left tib fib 05/13/2011 and plain films 05/16/2011
and 05/13/2011.

CLINICAL DATA: Complex tibial plateau fracture.  The patient is in
an external fixator.

CT LEFT KNEE WITHOUT CONTRAST
TECHNIQUE: Multidetector CT imaging of the left knee was performed
according to the standard protocol without intravenous contrast.
Multiplanar CT image reconstructions were also generated.
CLINICAL DATA: Status post fall with complex tibial fracture.
3-DIMENSIONAL CT IMAGE RENDERING AT INDEPENDENT WORKSTATION:
TECHNIQUE: 3-dimensional CT images were rendered by post-
processing of the original CT data at independent workstation.  The
3-dimensional CT images were interpreted, and findings were
reported in the accompanying complete CT report for this study.

[Series 5: lowextremity 2.0 b20s · axial · 0.44mm/px · z∈[+693,+831]mm · 5 of 105 slices shown, 7 images]
[im 18/105  soft-tissue]
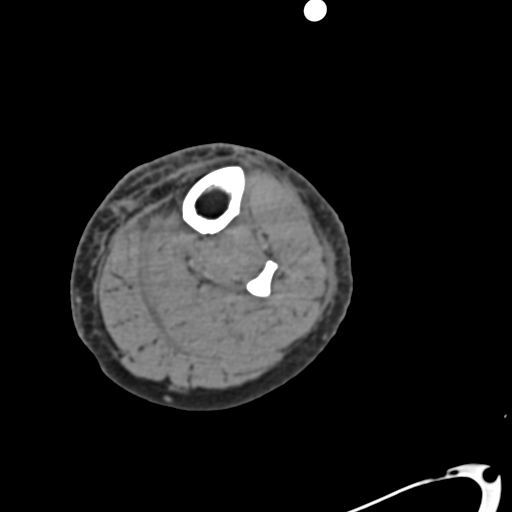
[im 18/105  bone]
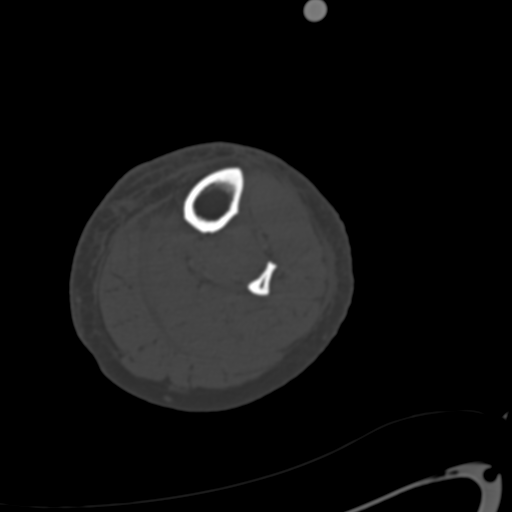
[im 35/105  bone]
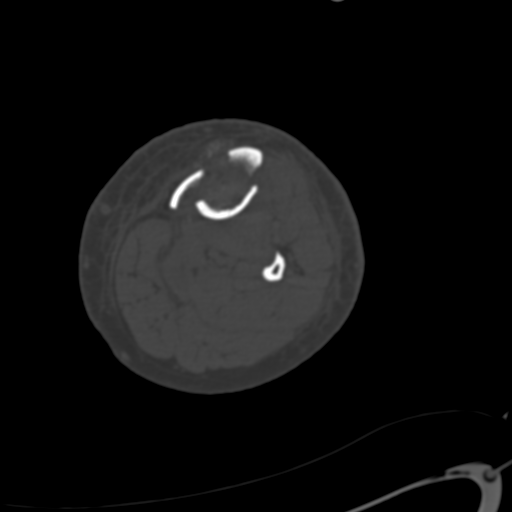
[im 53/105  bone]
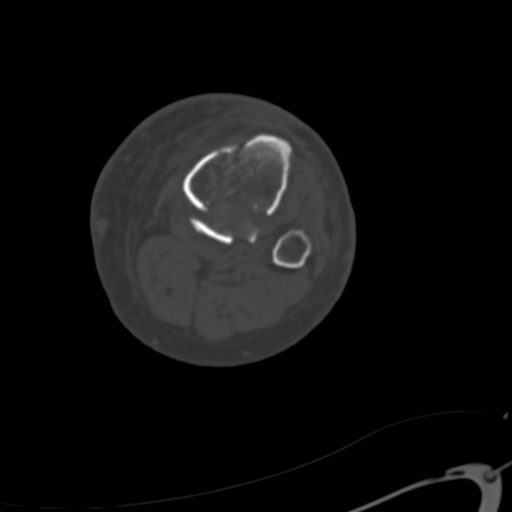
[im 70/105  bone]
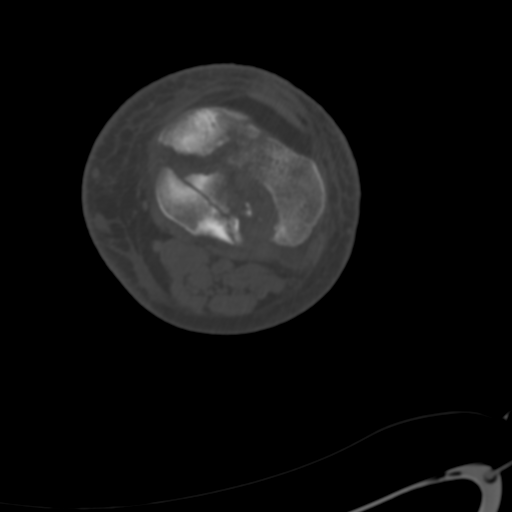
[im 87/105  soft-tissue]
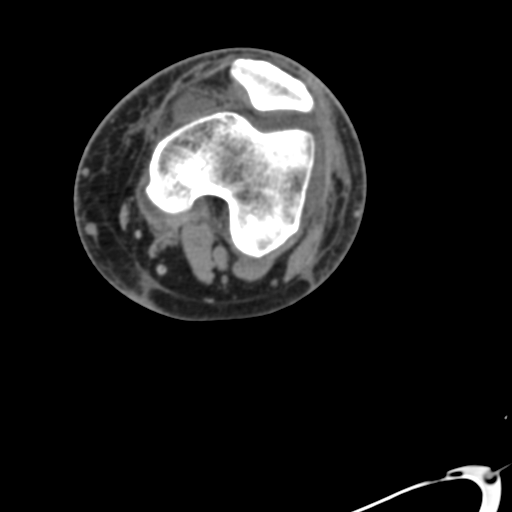
[im 87/105  bone]
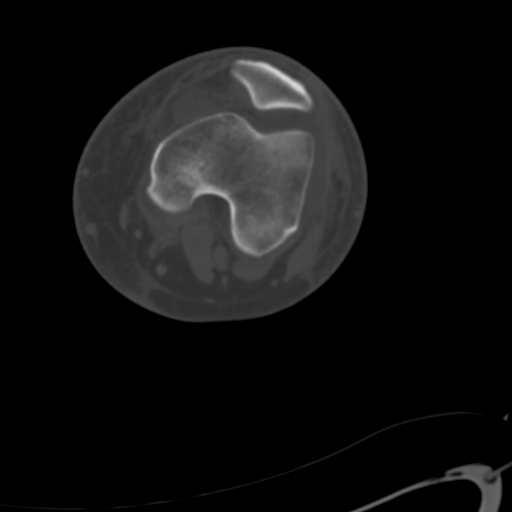

[Series 602: sag · sagittal · 0.44mm/px · 5 of 51 slices shown, 6 images]
[im 17/51  bone]
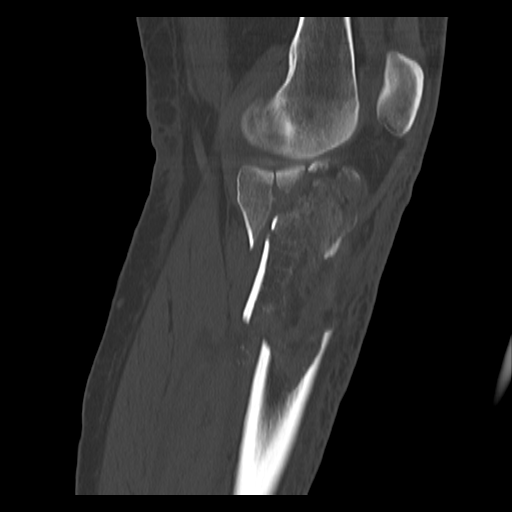
[im 21/51  bone]
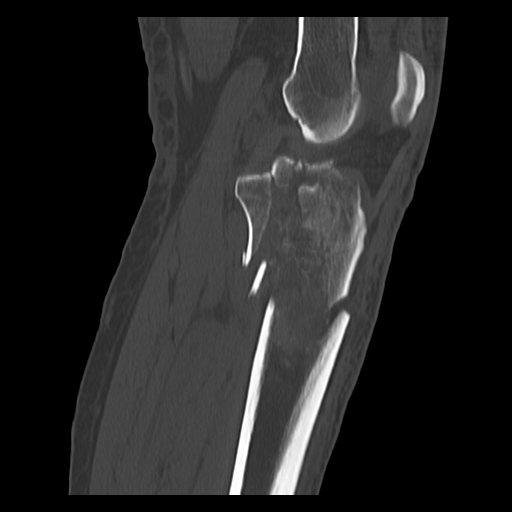
[im 26/51  soft-tissue]
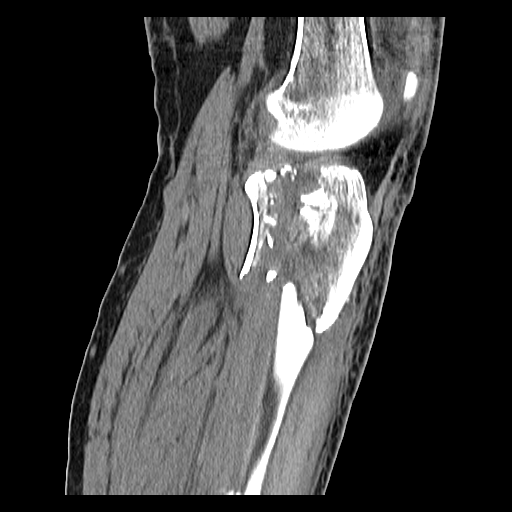
[im 26/51  bone]
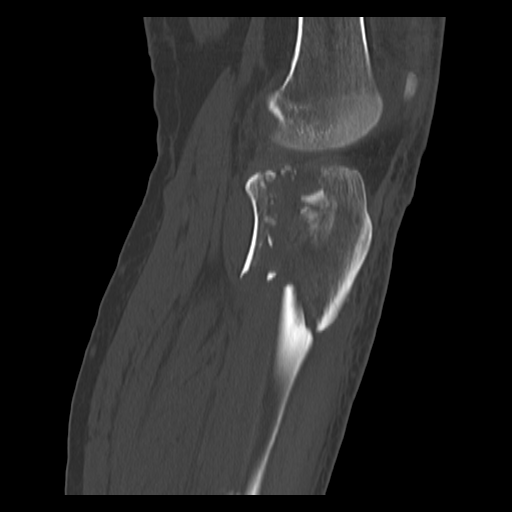
[im 30/51  bone]
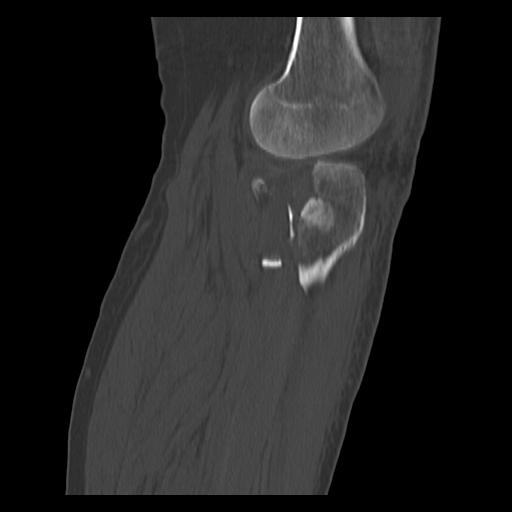
[im 34/51  bone]
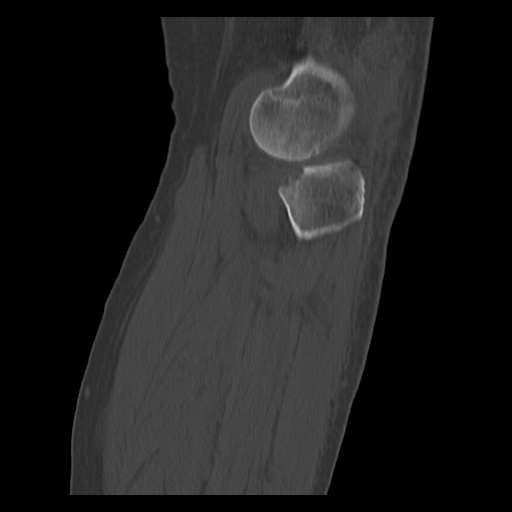

[Series 604: axial · axial · 0.44mm/px · z∈[+700,+837]mm · 5 of 103 slices shown]
[im 18/103  bone]
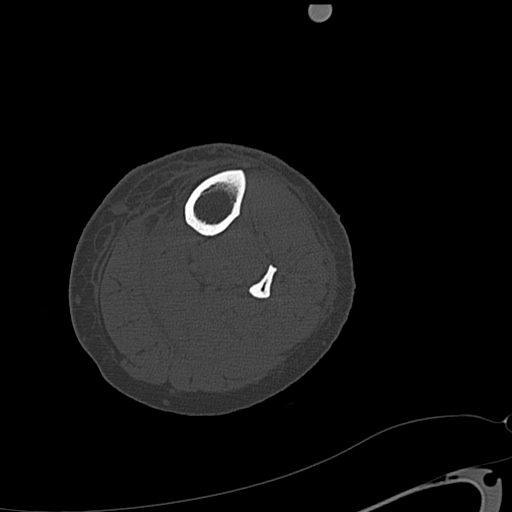
[im 35/103  bone]
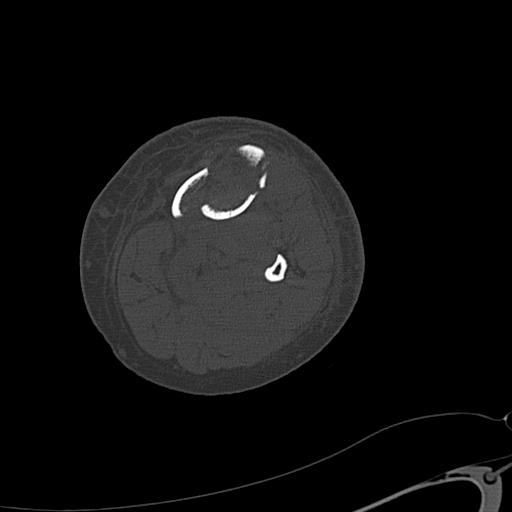
[im 52/103  bone]
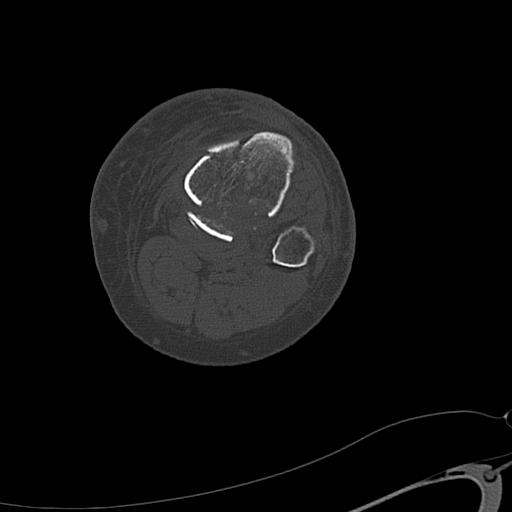
[im 69/103  bone]
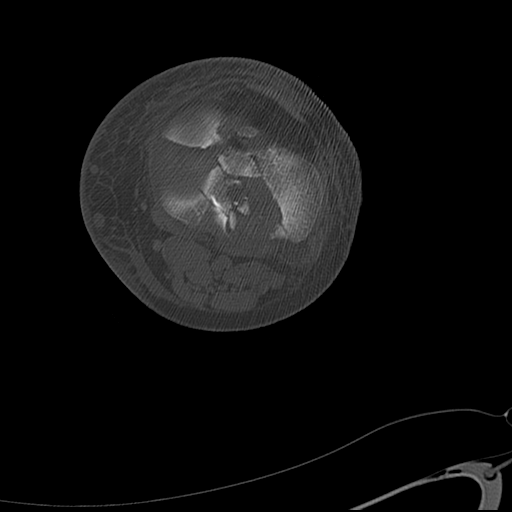
[im 86/103  bone]
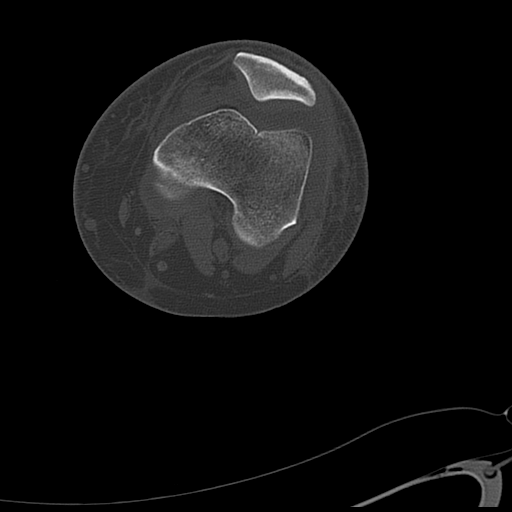

[15 of 27 positions shown; findings below may reference images not displayed]

FINDINGS: The patient has a highly comminuted fracture of both the
medial lateral tibial plateaus.  The tibial eminences are a
separate fragment.  The intact portions of both the medial and
lateral tibial plateaus are subluxed out of the joint line by up to
1 cm on the medial side and 1.1 cm on the lateral side.  A portion
of the fracture involving the lateral tibial plateau measures
approximately 2.6 x 2.6 cm at the articular surface is depressed
2.0 cm.  In the medial tibial plateau, a segment of the articular
surface measuring approximately 2.3 x 1.6 cm is depressed 1.4 cm.
The fracture extends inferiorly to exiting the medial tibial
diaphysis 8.5 cm below the medial plateau and 5.4 cm below the
lateral plateau.  Portion of the fracture through the medial
diaphysis is distracted up to 2.5 cm.

No other fracture is identified. Joint effusion is noted.
IMPRESSION: Highly comminuted and markedly depressed proximal tibial fracture
as described above.
FINDINGS: Surface rendered 3-D imaging confirms the above
findings.
IMPRESSION: As above.

## 2013-08-04 ENCOUNTER — Encounter: Payer: Self-pay | Admitting: Internal Medicine

## 2013-08-04 ENCOUNTER — Ambulatory Visit: Payer: Self-pay | Attending: Internal Medicine | Admitting: Internal Medicine

## 2013-08-04 VITALS — BP 140/101 | HR 84 | Temp 98.9°F | Resp 17 | Ht 64.0 in | Wt 172.0 lb

## 2013-08-04 DIAGNOSIS — I10 Essential (primary) hypertension: Secondary | ICD-10-CM | POA: Insufficient documentation

## 2013-08-04 DIAGNOSIS — F17200 Nicotine dependence, unspecified, uncomplicated: Secondary | ICD-10-CM | POA: Insufficient documentation

## 2013-08-04 DIAGNOSIS — R51 Headache: Secondary | ICD-10-CM | POA: Insufficient documentation

## 2013-08-04 DIAGNOSIS — Z139 Encounter for screening, unspecified: Secondary | ICD-10-CM

## 2013-08-04 DIAGNOSIS — J45909 Unspecified asthma, uncomplicated: Secondary | ICD-10-CM | POA: Insufficient documentation

## 2013-08-04 DIAGNOSIS — R519 Headache, unspecified: Secondary | ICD-10-CM | POA: Insufficient documentation

## 2013-08-04 LAB — CBC WITH DIFFERENTIAL/PLATELET
Basophils Relative: 0 % (ref 0–1)
Eosinophils Absolute: 0.2 10*3/uL (ref 0.0–0.7)
HCT: 44.7 % (ref 36.0–46.0)
Hemoglobin: 16 g/dL — ABNORMAL HIGH (ref 12.0–15.0)
MCH: 33.8 pg (ref 26.0–34.0)
MCHC: 35.8 g/dL (ref 30.0–36.0)
MCV: 94.5 fL (ref 78.0–100.0)
Monocytes Absolute: 0.5 10*3/uL (ref 0.1–1.0)
Monocytes Relative: 5 % (ref 3–12)

## 2013-08-04 LAB — LIPID PANEL
Cholesterol: 186 mg/dL (ref 0–200)
HDL: 43 mg/dL (ref >39)
Total CHOL/HDL Ratio: 4.3 Ratio

## 2013-08-04 LAB — COMPLETE METABOLIC PANEL WITH GFR
Albumin: 4.3 g/dL (ref 3.5–5.2)
Alkaline Phosphatase: 64 U/L (ref 39–117)
BUN: 13 mg/dL (ref 6–23)
GFR, Est Non African American: >89 mL/min
Glucose, Bld: 97 mg/dL (ref 70–99)
Total Bilirubin: 0.3 mg/dL (ref 0.3–1.2)

## 2013-08-04 MED ORDER — ALBUTEROL SULFATE HFA 108 (90 BASE) MCG/ACT IN AERS: 2.0000 | INHALATION_SPRAY | Freq: Four times a day (QID) | RESPIRATORY_TRACT | Status: DC | PRN

## 2013-08-04 MED ORDER — RAMIPRIL 2.5 MG PO CAPS: 2.5000 mg | ORAL_CAPSULE | Freq: Every day | ORAL | Status: DC

## 2013-08-04 NOTE — Patient Instructions (Signed)

## 2013-08-04 NOTE — Progress Notes (Signed)
Patient here to establish care States her blood pressure is always high but not on any medication  At this time Has history of asthma as well

## 2013-08-04 NOTE — Progress Notes (Signed)
Patient ID: [name], female   DOB: 02-20-1971, 42 y.o.   MRN: [id number] MRN: [id number] Name: [name]  Sex: female Age: 42 y.o. DOB: [birth year]  Allergies: Codeine  Chief Complaint  Patient presents with   Establish Care    HPI: Patient is 42 y.o. female who comes for the first time to establish medical care, as per patient she has history of asthma but has not been using any inhalers, she also history of hypertension and is not taking any medications, she reported to have some headache itches on the back of her neck and occipital area patient thinks it might be secondary to her blood pressure recently high denies any dizziness chest shortness of breath. Patient smokes cigarettes everyday and has tried patch and other medications and could not tolerate, she is trying to cut down.  Past Medical History  Diagnosis Date   Asthma    Hypertension     Past Surgical History  Procedure Laterality Date   Fracture surgery      8/12,9/12   Foot fracture surgery      pins due to "too much movement"   External fixation leg  09/26/2011    Procedure: EXTERNAL FIXATION LEG;  Surgeon: Budd Palmer, MD;  Location: [facility] OR;  Service: Orthopedics;  Laterality: Left;   Removal of Hardware Left Tibia with application wound vac      Medication List       This list is accurate as of: 08/04/13  4:10 PM.  Always use your most recent med list.               albuterol 108 (90 BASE) MCG/ACT inhaler  Commonly known as:  PROVENTIL HFA;VENTOLIN HFA  Inhale 2 puffs into the lungs every 6 (six) hours as needed for wheezing or shortness of breath.     CALTRATE 600+D 600-400 MG-UNIT per tablet  Generic drug:  Calcium Carbonate-Vitamin D  Take 1 tablet by mouth daily.     oxyCODONE-acetaminophen 7.5-325 MG per tablet  Commonly known as:  PERCOCET  Take 1-2 tablets by mouth every 6 (six) hours as needed. For pain     ramipril 2.5 MG capsule  Commonly known as:  ALTACE  Take 1  capsule (2.5 mg total) by mouth daily.        Meds ordered this encounter  Medications   albuterol (PROVENTIL HFA;VENTOLIN HFA) 108 (90 BASE) MCG/ACT inhaler    Sig: Inhale 2 puffs into the lungs every 6 (six) hours as needed for wheezing or shortness of breath.    Dispense:  1 Inhaler    Refill:  2   ramipril (ALTACE) 2.5 MG capsule    Sig: Take 1 capsule (2.5 mg total) by mouth daily.    Dispense:  90 capsule    Refill:  3    Immunization History  Administered Date(s) Administered   Td 10/12/2004    History  Substance Use Topics   Smoking status: Current Every Day Smoker -- 0.50 packs/day for 25 years    Types: Cigarettes   Smokeless tobacco: Never Used   Alcohol Use: 0.0 oz/week    1-2 Cans of beer per week    Review of Systems  As noted in HPI  Filed Vitals:   08/04/13 1550  BP: 140/101  Pulse:   Temp:   Resp:     Physical Exam  Physical Exam  Constitutional: No distress.  Eyes: EOM are normal. Pupils are equal, round, and reactive to  light.  Cardiovascular: Normal rate and regular rhythm.   Pulmonary/Chest: No respiratory distress. She has wheezes. She has no rales.  Musculoskeletal:  Left leg old surgical scar    CBC    Component Value Date/Time   WBC 5.6 09/28/2011 0600   RBC 4.08 09/28/2011 0600   RBC 4.08 09/28/2011 0600   HGB 12.9 09/28/2011 0600   HCT 38.3 09/28/2011 0600   PLT 176 09/28/2011 0600   MCV 93.9 09/28/2011 0600   LYMPHSABS 2.1 09/28/2011 0600   MONOABS 0.5 09/28/2011 0600   EOSABS 0.1 09/28/2011 0600   BASOSABS 0.0 09/28/2011 0600    CMP     Component Value Date/Time   NA 137 09/28/2011 0600   K 4.0 09/28/2011 0600   CL 104 09/28/2011 0600   CO2 26 09/28/2011 0600   GLUCOSE 105* 09/28/2011 0600   BUN 5* 09/28/2011 0600   CREATININE 0.50 09/28/2011 0600   CALCIUM 8.6 09/28/2011 0600   PROT 5.5* 09/27/2011 0535   ALBUMIN 2.8* 09/27/2011 0535   AST 14 09/27/2011 0535   ALT 8 09/27/2011 0535   ALKPHOS 74 09/27/2011 0535   BILITOT  1.4* 09/27/2011 0535   GFRNONAA >90 09/28/2011 0600   GFRAA >90 09/28/2011 0600    Lab Results  Component Value Date/Time   CHOL 162 04/24/2007  9:59 PM    No components found with this basename: hga1c    Lab Results  Component Value Date/Time   AST 14 09/27/2011  5:35 AM    Assessment and Plan  Essential hypertension, benign - Plan: I heard this patient for low salt diet and exercise, started on low-dose ramipril (ALTACE) 2.5 MG capsule everyday.  Headache(784.0) Most likely secondary to the hypertension.  Smoking Patient's trying to quit smoking on her own.  Unspecified asthma(493.90) - Plan: albuterol (PROVENTIL HFA;VENTOLIN HFA) 108 (90 BASE) MCG/ACT inhaler  Screening - Plan: CBC with Differential, COMPLETE METABOLIC PANEL WITH GFR, Lipid panel, TSH, Vit D  25 hydroxy (rtn osteoporosis monitoring), Ambulatory referral to Gynecology    Return in about 3 weeks (around 08/25/2013).  Doris Cheadle, MD

## 2013-08-05 LAB — VITAMIN D 25 HYDROXY (VIT D DEFICIENCY, FRACTURES): Vit D, 25-Hydroxy: 12 ng/mL — ABNORMAL LOW (ref 30–89)

## 2013-08-11 ENCOUNTER — Telehealth: Payer: Self-pay | Admitting: Internal Medicine

## 2013-08-11 ENCOUNTER — Ambulatory Visit: Payer: Self-pay

## 2013-08-11 ENCOUNTER — Telehealth: Payer: Self-pay | Admitting: Emergency Medicine

## 2013-08-11 MED ORDER — LISINOPRIL 10 MG PO TABS: 10.0000 mg | ORAL_TABLET | Freq: Every day | ORAL | Status: DC

## 2013-08-11 NOTE — Telephone Encounter (Signed)
Pt called regarding her  blood pressure medication that she was prescribed last week, pt states that it has not worked and her blood pressure is still high. Pt states that she has been having headaches for 3 days. Please contact pt

## 2013-08-11 NOTE — Telephone Encounter (Signed)
Informed pt to increase Lisinopril to 10mg  daily and monitor BP with reading and watch sodium intake per Dr. Orpah Cobb

## 2013-08-11 NOTE — Telephone Encounter (Signed)
Spoke with pt regarding high BP readings 11/29 160/112,147/ 97, 159/101 Frequent h/a's x 2 dys with white spots

## 2013-08-12 ENCOUNTER — Other Ambulatory Visit: Payer: Self-pay | Admitting: Internal Medicine

## 2013-08-12 DIAGNOSIS — J45909 Unspecified asthma, uncomplicated: Secondary | ICD-10-CM

## 2013-08-12 MED ORDER — ALBUTEROL SULFATE HFA 108 (90 BASE) MCG/ACT IN AERS: 2.0000 | INHALATION_SPRAY | Freq: Four times a day (QID) | RESPIRATORY_TRACT | Status: DC | PRN

## 2013-08-21 ENCOUNTER — Ambulatory Visit: Payer: Self-pay | Admitting: Pharmacist

## 2013-08-21 VITALS — BP 175/108 | HR 78 | Temp 98.0°F | Resp 18

## 2013-08-21 NOTE — Progress Notes (Signed)
Patient just happened to stop by to pick up prescription at the pharmacy Wanted her blood pressure checked 175/108 hr 92 Spoke with Dr Lucretia Roers- instructed the patient to be seen over at the urgent care

## 2013-08-27 ENCOUNTER — Encounter: Payer: Self-pay | Admitting: Internal Medicine

## 2013-08-27 ENCOUNTER — Ambulatory Visit: Payer: Self-pay | Attending: Internal Medicine | Admitting: Internal Medicine

## 2013-08-27 VITALS — BP 172/92 | HR 83 | Temp 98.6°F | Resp 14 | Ht 64.0 in | Wt 175.8 lb

## 2013-08-27 DIAGNOSIS — I10 Essential (primary) hypertension: Secondary | ICD-10-CM | POA: Insufficient documentation

## 2013-08-27 MED ORDER — VITAMIN D (ERGOCALCIFEROL) 1.25 MG (50000 UNIT) PO CAPS: 50000.0000 [IU] | ORAL_CAPSULE | ORAL | Status: DC

## 2013-08-27 MED ORDER — LISINOPRIL 20 MG PO TABS: 20.0000 mg | ORAL_TABLET | Freq: Every day | ORAL | Status: DC

## 2013-08-27 NOTE — Progress Notes (Signed)
Patient ID: [name], female   DOB: 07/15/1971, 42 y.o.   MRN: [id number]  CC: Followup  HPI: Patient is 42 year old female with hypertension who presents to clinic for blood pressure check. She has brought a list with blood pressure monitoring recording period it is noted that blood pressure ranges from 140-160 over mid 90s. Patient reports compliance with lisinopril 10 mg by mouth daily. She denies chest pain shortness of breath, reports that her headaches are now resolved, no specific abdominal or urinary concerns.  Allergies  Allergen Reactions  . Codeine Itching   Past Medical History  Diagnosis Date  . Asthma   . Hypertension    Current Outpatient Prescriptions on File Prior to Visit  Medication Sig Dispense Refill  . albuterol (PROVENTIL HFA;VENTOLIN HFA) 108 (90 BASE) MCG/ACT inhaler Inhale 2 puffs into the lungs every 6 (six) hours as needed for wheezing or shortness of breath.  2 Inhaler  1 year  . lisinopril (PRINIVIL,ZESTRIL) 10 MG tablet Take 1 tablet (10 mg total) by mouth daily.  90 tablet  3  . Calcium Carbonate-Vitamin D (CALTRATE 600+D) 600-400 MG-UNIT per tablet Take 1 tablet by mouth daily.      Marland Kitchen oxyCODONE-acetaminophen (PERCOCET) 7.5-325 MG per tablet Take 1-2 tablets by mouth every 6 (six) hours as needed. For pain  80 tablet  0   No current facility-administered medications on file prior to visit.   Family History  Problem Relation Age of Onset  . Hypertension Mother   . Cancer Mother   . Hypertension Father   . Diabetes Father   . Heart disease Father   . Stroke Father   . Cancer Father     bladder cancer    History   Social History  . Marital Status: Single    Spouse Name: N/A    Number of Children: N/A  . Years of Education: N/A   Occupational History  . Not on file.   Social History Main Topics  . Smoking status: Current Every Day Smoker -- 0.50 packs/day for 25 years    Types: Cigarettes  . Smokeless tobacco: Never Used  . Alcohol  Use: 0.0 oz/week    1-2 Cans of beer per week  . Drug Use: Yes    Special: Marijuana  . Sexual Activity: Yes   Other Topics Concern  . Not on file   Social History Narrative  . No narrative on file    Review of Systems  Constitutional: Negative for fever, chills, diaphoresis, activity change, appetite change and fatigue.  HENT: Negative for ear pain, nosebleeds, congestion, facial swelling, rhinorrhea, neck pain, neck stiffness and ear discharge.   Eyes: Negative for pain, discharge, redness, itching and visual disturbance.  Respiratory: Negative for cough, choking, chest tightness, shortness of breath, wheezing and stridor.   Cardiovascular: Negative for chest pain, palpitations and leg swelling.  Gastrointestinal: Negative for abdominal distention.  Genitourinary: Negative for dysuria, urgency, frequency, hematuria, flank pain, decreased urine volume, difficulty urinating and dyspareunia.  Musculoskeletal: Negative for back pain, joint swelling, arthralgias and gait problem.  Neurological: Negative for dizziness, tremors, seizures, syncope, facial asymmetry, speech difficulty, weakness, light-headedness, numbness and headaches.  Hematological: Negative for adenopathy. Does not bruise/bleed easily.  Psychiatric/Behavioral: Negative for hallucinations, behavioral problems, confusion, dysphoric mood, decreased concentration and agitation.    Objective:   Filed Vitals:   08/27/13 1523  BP: 172/92  Pulse: 83  Temp: 98.6 F (37 C)  Resp: 14    Physical Exam  Constitutional:  Appears well-developed and well-nourished. No distress.  CVS: RRR, S1/S2 +, no murmurs, no gallops, no carotid bruit.  Pulmonary: Effort and breath sounds normal, no stridor, rhonchi, wheezes, rales.  Abdominal: Soft. BS +,  no distension, tenderness, rebound or guarding.  Musculoskeletal: Normal range of motion. No edema and no tenderness.    Lab Results  Component Value Date   WBC 9.3 08/04/2013    HGB 16.0* 08/04/2013   HCT 44.7 08/04/2013   MCV 94.5 08/04/2013   PLT 273 08/04/2013   Lab Results  Component Value Date   CREATININE 0.72 08/04/2013   BUN 13 08/04/2013   NA 137 08/04/2013   K 4.1 08/04/2013   CL 104 08/04/2013   CO2 25 08/04/2013    No results found for this basename: HGBA1C   Lipid Panel     Component Value Date/Time   CHOL 186 08/04/2013 1605   TRIG 254* 08/04/2013 1605   HDL 43 08/04/2013 1605   CHOLHDL 4.3 08/04/2013 1605   VLDL 51* 08/04/2013 1605   LDLCALC 92 08/04/2013 1605       Assessment and plan:   Patient Active Problem List   Diagnosis Date Noted  . Essential hypertension, benign - still above target range. We have discussed target blood pressure range and I have advised patient to come back in one week to have her blood pressure rechecked so that we can readjust the dose of lisinopril. For now we'll increase the lisinopril from 10 mg by mouth daily to 20 mg by mouth day  08/04/2013  . Headache(784.0) - resolved  08/04/2013

## 2013-08-27 NOTE — Progress Notes (Signed)
Pt is here for a f/u of hypertension. Headaches have stopped. Complains that the dosage of Lisinopril is helping. BP is 172/92.

## 2013-08-27 NOTE — Patient Instructions (Signed)

## 2013-09-03 ENCOUNTER — Ambulatory Visit: Payer: Self-pay | Attending: Internal Medicine | Admitting: Pharmacist

## 2013-09-03 VITALS — BP 150/97 | HR 90 | Temp 97.0°F | Resp 18

## 2013-09-03 DIAGNOSIS — I10 Essential (primary) hypertension: Secondary | ICD-10-CM

## 2013-09-03 NOTE — Progress Notes (Signed)
Patient here for blood pressure check B/p today 150/97

## 2013-09-03 NOTE — Progress Notes (Signed)
   Subjective:    Patient ID: [name], female    DOB: 1970/10/02, 41 y.o.   MRN: [id number]  HPI    Review of Systems     Objective:   Physical Exam        Assessment & Plan:

## 2013-10-03 ENCOUNTER — Encounter: Payer: Self-pay | Admitting: Medical

## 2013-10-14 ENCOUNTER — Ambulatory Visit: Payer: Self-pay

## 2013-10-27 ENCOUNTER — Ambulatory Visit: Payer: Self-pay | Admitting: Internal Medicine

## 2013-10-29 ENCOUNTER — Ambulatory Visit: Payer: Self-pay | Admitting: Pharmacist

## 2013-11-05 ENCOUNTER — Ambulatory Visit: Payer: Self-pay | Attending: Internal Medicine | Admitting: Pharmacist

## 2013-11-05 ENCOUNTER — Encounter: Payer: Self-pay | Admitting: Pharmacist

## 2013-11-05 VITALS — BP 178/103 | HR 76 | Temp 98.8°F | Ht 64.5 in | Wt 178.8 lb

## 2013-11-05 DIAGNOSIS — I10 Essential (primary) hypertension: Secondary | ICD-10-CM

## 2013-11-05 MED ORDER — HYDROCHLOROTHIAZIDE 25 MG PO TABS: 25.0000 mg | ORAL_TABLET | Freq: Every day | ORAL | Status: DC

## 2013-11-05 NOTE — Progress Notes (Signed)
S:    Patient arrives and has no complaints.  She states her blood pressure has been high at home.    She presents to the clinic for ambulatory blood pressure evaluation.   Medication compliance is patient taking everyday in the morning .  Current BP Medications include:  Lisinopril 20 mg  Antihypertensives tried in the past include: n/a  Patient returned to the clinic and reported no headaches, chest pain, or shortness of breath.  O:  Last 2 Office BP readings: 172/92 mmHg 140/101 mmHg  Today's Office BP reading: 174/101 mmHg   BMET    Component Value Date/Time   NA 137 08/04/2013 1605   K 4.1 08/04/2013 1605   CL 104 08/04/2013 1605   CO2 25 08/04/2013 1605   GLUCOSE 97 08/04/2013 1605   BUN 13 08/04/2013 1605   CREATININE 0.72 08/04/2013 1605   CREATININE 0.50 09/28/2011 0600   CALCIUM 9.2 08/04/2013 1605   GFRNONAA >90 09/28/2011 0600   GFRAA >90 09/28/2011 0600    A/P: History of hypertension for several years. Changes to medications adding HCTZ 25 mg and continue Lisinopril 20 mg.  Pt will follow up in 2 weeks with PharmD.  Results reviewed and written information provided.

## 2013-11-05 NOTE — Patient Instructions (Signed)
DASH Diet  The DASH diet stands for "Dietary Approaches to Stop Hypertension." It is a healthy eating plan that has been shown to reduce high blood pressure (hypertension) in as little as 14 days, while also possibly providing other significant health benefits. These other health benefits include reducing the risk of breast cancer after menopause and reducing the risk of type 2 diabetes, heart disease, colon cancer, and stroke. Health benefits also include weight loss and slowing kidney failure in patients with chronic kidney disease.   DIET GUIDELINES  · Limit salt (sodium). Your diet should contain less than 1500 mg of sodium daily.  · Limit refined or processed carbohydrates. Your diet should include mostly whole grains. Desserts and added sugars should be used sparingly.  · Include small amounts of heart-healthy fats. These types of fats include nuts, oils, and tub margarine. Limit saturated and trans fats. These fats have been shown to be harmful in the body.  CHOOSING FOODS   The following food groups are based on a 2000 calorie diet. See your Registered Dietitian for individual calorie needs.  Grains and Grain Products (6 to 8 servings daily)  · Eat More Often: Whole-wheat bread, brown rice, whole-grain or wheat pasta, quinoa, popcorn without added fat or salt (air popped).  · Eat Less Often: White bread, white pasta, white rice, cornbread.  Vegetables (4 to 5 servings daily)  · Eat More Often: Fresh, frozen, and canned vegetables. Vegetables may be raw, steamed, roasted, or grilled with a minimal amount of fat.  · Eat Less Often/Avoid: Creamed or fried vegetables. Vegetables in a cheese sauce.  Fruit (4 to 5 servings daily)  · Eat More Often: All fresh, canned (in natural juice), or frozen fruits. Dried fruits without added sugar. One hundred percent fruit juice (½ cup [237 mL] daily).  · Eat Less Often: Dried fruits with added sugar. Canned fruit in light or heavy syrup.  Lean Meats, Fish, and Poultry (2  servings or less daily. One serving is 3 to 4 oz [85-114 g]).  · Eat More Often: Ninety percent or leaner ground beef, tenderloin, sirloin. Round cuts of beef, chicken breast, turkey breast. All fish. Grill, bake, or broil your meat. Nothing should be fried.  · Eat Less Often/Avoid: Fatty cuts of meat, turkey, or chicken leg, thigh, or wing. Fried cuts of meat or fish.  Dairy (2 to 3 servings)  · Eat More Often: Low-fat or fat-free milk, low-fat plain or light yogurt, reduced-fat or part-skim cheese.  · Eat Less Often/Avoid: Milk (whole, 2%). Whole milk yogurt. Full-fat cheeses.  Nuts, Seeds, and Legumes (4 to 5 servings per week)  · Eat More Often: All without added salt.  · Eat Less Often/Avoid: Salted nuts and seeds, canned beans with added salt.  Fats and Sweets (limited)  · Eat More Often: Vegetable oils, tub margarines without trans fats, sugar-free gelatin. Mayonnaise and salad dressings.  · Eat Less Often/Avoid: Coconut oils, palm oils, butter, stick margarine, cream, half and half, cookies, candy, pie.  FOR MORE INFORMATION  The Dash Diet Eating Plan: www.dashdiet.org  Document Released: 08/17/2011 Document Revised: 11/20/2011 Document Reviewed: 08/17/2011  [employer]® Patient Information ©2014 [employer].

## 2013-11-12 ENCOUNTER — Telehealth: Payer: Self-pay | Admitting: Pharmacist

## 2013-11-12 NOTE — Telephone Encounter (Signed)
Pt called yesterday and stated that her blood pressure had dropped and she was feeling bad.  She had stopped taking the HCTZ 25 mg that was started last week.  She had only been taking her Lisinopril 20 mg.  Pt was told to continue Lisinopril 20 mg and take HCTZ 12.5 mg (half of the 25 mg tablet).  She called back today and reported her BP as 111/76 and is feeling much better.  She is to call if she has any more issues and will come to her appt next week.

## 2013-11-17 ENCOUNTER — Ambulatory Visit: Payer: No Typology Code available for payment source | Attending: Internal Medicine

## 2013-11-18 ENCOUNTER — Other Ambulatory Visit: Payer: No Typology Code available for payment source

## 2013-11-18 ENCOUNTER — Ambulatory Visit: Payer: No Typology Code available for payment source | Attending: Internal Medicine

## 2013-11-18 ENCOUNTER — Ambulatory Visit: Payer: No Typology Code available for payment source | Attending: Internal Medicine | Admitting: Pharmacist

## 2013-11-18 ENCOUNTER — Encounter: Payer: Self-pay | Admitting: Pharmacist

## 2013-11-18 VITALS — BP 120/76 | HR 78 | Ht 64.0 in | Wt 172.6 lb

## 2013-11-18 DIAGNOSIS — I10 Essential (primary) hypertension: Secondary | ICD-10-CM

## 2013-11-18 DIAGNOSIS — Z136 Encounter for screening for cardiovascular disorders: Secondary | ICD-10-CM | POA: Insufficient documentation

## 2013-11-18 DIAGNOSIS — Z0000 Encounter for general adult medical examination without abnormal findings: Secondary | ICD-10-CM

## 2013-11-18 DIAGNOSIS — Z599 Problem related to housing and economic circumstances, unspecified: Secondary | ICD-10-CM

## 2013-11-18 LAB — CBC WITH DIFFERENTIAL/PLATELET
Basophils Absolute: 0.1 10*3/uL (ref 0.0–0.1)
Basophils Relative: 1 % (ref 0–1)
Eosinophils Absolute: 0.1 10*3/uL (ref 0.0–0.7)
Eosinophils Relative: 1 % (ref 0–5)
HCT: 46.1 % — ABNORMAL HIGH (ref 36.0–46.0)
HEMOGLOBIN: 16.2 g/dL — AB (ref 12.0–15.0)
LYMPHS ABS: 2.8 10*3/uL (ref 0.7–4.0)
LYMPHS PCT: 30 % (ref 12–46)
MCH: 32.7 pg (ref 26.0–34.0)
MCHC: 35.1 g/dL (ref 30.0–36.0)
MCV: 92.9 fL (ref 78.0–100.0)
MONOS PCT: 6 % (ref 3–12)
Monocytes Absolute: 0.6 10*3/uL (ref 0.1–1.0)
NEUTROS PCT: 62 % (ref 43–77)
Neutro Abs: 5.7 10*3/uL (ref 1.7–7.7)
PLATELETS: 294 10*3/uL (ref 150–400)
RBC: 4.96 MIL/uL (ref 3.87–5.11)
RDW: 13.5 % (ref 11.5–15.5)
WBC: 9.2 10*3/uL (ref 4.0–10.5)

## 2013-11-18 LAB — COMPREHENSIVE METABOLIC PANEL
ALBUMIN: 4.2 g/dL (ref 3.5–5.2)
ALT: 20 U/L (ref 0–35)
AST: 22 U/L (ref 0–37)
Alkaline Phosphatase: 63 U/L (ref 39–117)
BUN: 18 mg/dL (ref 6–23)
CALCIUM: 10.2 mg/dL (ref 8.4–10.5)
CHLORIDE: 97 meq/L (ref 96–112)
CO2: 27 meq/L (ref 19–32)
Creat: 0.84 mg/dL (ref 0.50–1.10)
Glucose, Bld: 100 mg/dL — ABNORMAL HIGH (ref 70–99)
POTASSIUM: 4.5 meq/L (ref 3.5–5.3)
SODIUM: 134 meq/L — AB (ref 135–145)
TOTAL PROTEIN: 6.9 g/dL (ref 6.0–8.3)
Total Bilirubin: 0.4 mg/dL (ref 0.2–1.2)

## 2013-11-18 LAB — LIPID PANEL
CHOLESTEROL: 199 mg/dL (ref 0–200)
HDL: 35 mg/dL — ABNORMAL LOW (ref >39)
LDL CALC: 96 mg/dL (ref 0–99)
Total CHOL/HDL Ratio: 5.7 Ratio
Triglycerides: 341 mg/dL — ABNORMAL HIGH (ref <150)
VLDL: 68 mg/dL — AB (ref 0–40)

## 2013-11-18 LAB — TSH: TSH: 2.423 u[IU]/mL (ref 0.350–4.500)

## 2013-11-18 MED ORDER — HYDROCHLOROTHIAZIDE 12.5 MG PO TABS: 12.5000 mg | ORAL_TABLET | Freq: Every day | ORAL | Status: DC

## 2013-11-18 NOTE — Progress Notes (Signed)
S:    Patient arrives to the clinic for ambulatory blood pressure evaluation.     Medication compliance is the patient reports taking daily.  Current BP Medications include:  Lisinopril 20 mg, HCTZ 12.5 mg  Antihypertensives tried in the past include:  HCTZ 25 mg  Patient returned to the clinic and reported no symptoms at this time.  She had called last week and was experiencing dizziness and was told to take half of the HCTZ 25 mg.  O:  Last 3 Office BP readings: 178/108 mmHg  172/92 mmHg  140/101 mmHg  Today's Office BP reading: 120/76 mmHg   BMET    Component Value Date/Time   NA 137 08/04/2013 1605   K 4.1 08/04/2013 1605   CL 104 08/04/2013 1605   CO2 25 08/04/2013 1605   GLUCOSE 97 08/04/2013 1605   BUN 13 08/04/2013 1605   CREATININE 0.72 08/04/2013 1605   CREATININE 0.50 09/28/2011 0600   CALCIUM 9.2 08/04/2013 1605   GFRNONAA >90 09/28/2011 0600   GFRAA >90 09/28/2011 0600    A/P:  There are no changes at this time.  BP is at goal this visit (<140/90).  Pt was changed from HCTZ 25mg  to HCTZ 12.5 mg last week per phone conversation.  She will continue her current regimen of Lisinopril 20 mg and HCTZ 12.5 mg.  She will also continue to monitor her BP at home.

## 2013-11-19 NOTE — Progress Notes (Deleted)
   Subjective:    Patient ID: [name], female    DOB: 04/07/1971, 43 y.o.   MRN: [id number]  HPI    Review of Systems     Objective:   Physical Exam        Assessment & Plan:

## 2013-11-19 NOTE — Progress Notes (Unsigned)
Patient notary needs covered by another notary in this office. No further needs.

## 2013-11-21 ENCOUNTER — Encounter: Payer: Self-pay | Admitting: Family Medicine

## 2013-11-21 ENCOUNTER — Other Ambulatory Visit (HOSPITAL_COMMUNITY)
Admission: RE | Admit: 2013-11-21 | Discharge: 2013-11-21 | Disposition: A | Discharge status: Home / Self Care | Payer: No Typology Code available for payment source | Source: Ambulatory Visit | Attending: Family Medicine | Admitting: Family Medicine

## 2013-11-21 ENCOUNTER — Telehealth: Payer: Self-pay | Admitting: Pharmacist

## 2013-11-21 ENCOUNTER — Ambulatory Visit (INDEPENDENT_AMBULATORY_CARE_PROVIDER_SITE_OTHER): Payer: No Typology Code available for payment source | Admitting: Family Medicine

## 2013-11-21 VITALS — BP 132/91 | HR 74 | Temp 97.6°F | Ht 64.0 in | Wt 173.3 lb

## 2013-11-21 DIAGNOSIS — Z01419 Encounter for gynecological examination (general) (routine) without abnormal findings: Secondary | ICD-10-CM

## 2013-11-21 DIAGNOSIS — N970 Female infertility associated with anovulation: Secondary | ICD-10-CM

## 2013-11-21 DIAGNOSIS — E781 Pure hyperglyceridemia: Secondary | ICD-10-CM

## 2013-11-21 DIAGNOSIS — Z0000 Encounter for general adult medical examination without abnormal findings: Secondary | ICD-10-CM

## 2013-11-21 DIAGNOSIS — R6889 Other general symptoms and signs: Secondary | ICD-10-CM | POA: Insufficient documentation

## 2013-11-21 DIAGNOSIS — Z01812 Encounter for preprocedural laboratory examination: Secondary | ICD-10-CM

## 2013-11-21 LAB — POCT PREGNANCY, URINE: PREG TEST UR: NEGATIVE

## 2013-11-21 MED ORDER — GEMFIBROZIL 600 MG PO TABS: 600.0000 mg | ORAL_TABLET | Freq: Two times a day (BID) | ORAL | Status: DC

## 2013-11-21 NOTE — Progress Notes (Signed)
Subjective:     Patient ID: [name], female   DOB: 12/16/1970, 43 y.o.   MRN: [id number]  HPI 43 y.o.  F presents with 2 years of irregular bleeding. Pt reports that periods will come between 2 weeks and 2 months. Typically will last for 10 days. Heavy day 3-4 then decreases over the next 4 days. Pt reports that she was regular prior to this episodes. Pt reports that she uses tampons and pads 8 pads/8 tampons/day.   Hx of abnl pap smears with cryo therapy x2.   Review of Systems No light headed, no SOB, no chest pain or palpitations. +thick discharge over 6 months, Last partner 6 months ago.     Objective:   Physical Exam Filed Vitals:   11/21/13 0931  BP: 132/91  Pulse: 74  Temp: 97.6 F (36.4 C)   VSS- mildly elevated BP, NAD Cervix normal appearance. Minimal bleeding post pap No CMT No adenexal tenderness No significant discharge.     Assessment:     [name] is a 43 y.o. G2P1011 with anovulatory bleeding. Hx of abnormal pap smears. Will evaluate with PAP and EMB.     Plan:     EMB for annovulatory bleeding. Likely related to perimenopausal but will evaluate for malignancy. Declines OCP and not a candidate given smoker. May be candidate for mirena placement  Pap smear. Out of date with hx of abnormal pap.      Tawana ScaleMichael Ryan Libero Puthoff, MD [facility]  Patient given informed consent, signed copy in the chart, time out was performed. Appropriate time out taken. . The patient was placed in the lithotomy position and the cervix brought into view with sterile speculum.  Portio of cervix cleansed x 2 with betadine swabs.  A tenaculum was placed in the anterior lip of the cervix.  The uterus was sounded for depth of 9. A pipelle was introduced to into the uterus, suction created,  and an endometrial sample was obtained. All equipment was removed and accounted for.  The patient tolerated the procedure well.    Patient given post procedure instructions. The patient will  return in 2-4 weeks for results.

## 2013-11-21 NOTE — Telephone Encounter (Signed)
Pt had lab work on 11/19/13 and triglyceride levels were high.  Will have [facility] for Gemfibrozil 600 mg BID

## 2013-11-21 NOTE — Patient Instructions (Signed)

## 2013-11-25 ENCOUNTER — Telehealth: Payer: Self-pay | Admitting: Pharmacist

## 2013-11-25 NOTE — Telephone Encounter (Signed)
Explained to pt that her triglyceride levels were high and what this meant and how she could bring the number down with diet and exercise.  Also, told her she had been prescribed Gemfibrozil 600 mg BID.  Pt understood the information.

## 2013-11-26 ENCOUNTER — Encounter: Payer: Self-pay | Admitting: Family Medicine

## 2013-11-26 DIAGNOSIS — IMO0002 Reserved for concepts with insufficient information to code with codable children: Secondary | ICD-10-CM | POA: Insufficient documentation

## 2013-11-27 ENCOUNTER — Telehealth: Payer: Self-pay | Admitting: *Deleted

## 2013-11-27 NOTE — Telephone Encounter (Signed)
Message copied by Gerome ApleyZEYFANG, Timmya Blazier L on Thu Nov 27, 2013  8:19 AM ------      Message from: Jolyn LentDOM, MICHAEL R      Created: Wed Nov 26, 2013  7:33 PM       Please call and inform pt that she needs her next PAP smear in 3 years with cotesting.            MRO ------

## 2013-11-27 NOTE — Telephone Encounter (Addendum)
Called [name] and notified her pap results reviewed and reccomendation is repeat pap in 3 years with cotesting, but still needs annual pelvic exam.  She asked if any infection showed.  Reviewed chart and informed her GC/CHlam negative, but no other tests done for infection. States still having some discharge- instructed her if continues to make appt.

## 2013-12-01 ENCOUNTER — Encounter: Payer: Self-pay | Admitting: *Deleted

## 2013-12-09 ENCOUNTER — Ambulatory Visit: Payer: No Typology Code available for payment source | Attending: Internal Medicine | Admitting: Pharmacist

## 2013-12-09 ENCOUNTER — Encounter: Payer: Self-pay | Admitting: Pharmacist

## 2013-12-09 VITALS — BP 124/82 | HR 97 | Temp 99.1°F | Ht 64.0 in | Wt 185.2 lb

## 2013-12-09 DIAGNOSIS — I10 Essential (primary) hypertension: Secondary | ICD-10-CM | POA: Insufficient documentation

## 2013-12-09 NOTE — Progress Notes (Signed)
S:    Patient arrives to the clinic for ambulatory blood pressure evaluation.   Medication compliance is patient taking medication daily.  Current BP Medications include:  Lisinopril 20 mg, HCTZ 12.5 mg  Antihypertensives tried in the past include: HCTZ 25 mg  Patient returned to the clinic and reported that she had been feeling nauseous and short of breath on Saturday and her blood pressure was reading 100/70 mmHg.  I talked with the pt on yesterday and told her to stop taking the HCTZ and have her come in today.  O:  Last 3 Office BP readings: 120/76 mmHg 178/103 mmHg   Today's Office BP reading:  124/82 mmHg   BMET    Component Value Date/Time   NA 134* 11/18/2013 1503   K 4.5 11/18/2013 1503   CL 97 11/18/2013 1503   CO2 27 11/18/2013 1503   GLUCOSE 100* 11/18/2013 1503   BUN 18 11/18/2013 1503   CREATININE 0.84 11/18/2013 1503   CREATININE 0.50 09/28/2011 0600   CALCIUM 10.2 11/18/2013 1503   GFRNONAA >89 08/04/2013 1605   GFRNONAA >90 09/28/2011 0600   GFRAA >89 08/04/2013 1605   GFRAA >90 09/28/2011 0600    A/P: Hypertension BP is at goal this visit (<140/90).  Will have pt discontinue HCTZ 12.5 mg and continue Lisinopril 20 mg.  Pt will continue to monitor blood pressure at home and call if there are any issues.  She has a follow up visit with Dr. Hyman HopesJegede in June.

## 2013-12-15 ENCOUNTER — Ambulatory Visit: Payer: Self-pay | Admitting: Family Medicine

## 2014-01-09 ENCOUNTER — Telehealth: Payer: Self-pay

## 2014-01-09 NOTE — Telephone Encounter (Signed)
Called pt in regards to the Mirena application.  Pt informed me that she is not interested in the procedure.  I asked pt is she still having bleeding pt stated "yes, I have been dealing with this for a long time and the provider told me that it could be related to being perimenopausal".  I advised pt that with her saying that she does not have to follow up but when can schedule when she needs to.  Pt agreed.

## 2014-02-19 ENCOUNTER — Encounter: Payer: Self-pay | Admitting: Internal Medicine

## 2014-02-19 ENCOUNTER — Ambulatory Visit: Payer: No Typology Code available for payment source | Attending: Internal Medicine | Admitting: Internal Medicine

## 2014-02-19 VITALS — BP 131/91 | HR 90 | Temp 98.2°F | Resp 14 | Ht 64.0 in | Wt 175.0 lb

## 2014-02-19 DIAGNOSIS — J45909 Unspecified asthma, uncomplicated: Secondary | ICD-10-CM | POA: Insufficient documentation

## 2014-02-19 DIAGNOSIS — I10 Essential (primary) hypertension: Secondary | ICD-10-CM | POA: Insufficient documentation

## 2014-02-19 DIAGNOSIS — M545 Low back pain, unspecified: Secondary | ICD-10-CM | POA: Insufficient documentation

## 2014-02-19 DIAGNOSIS — E781 Pure hyperglyceridemia: Secondary | ICD-10-CM | POA: Insufficient documentation

## 2014-02-19 DIAGNOSIS — F17200 Nicotine dependence, unspecified, uncomplicated: Secondary | ICD-10-CM | POA: Insufficient documentation

## 2014-02-19 DIAGNOSIS — Z79899 Other long term (current) drug therapy: Secondary | ICD-10-CM | POA: Insufficient documentation

## 2014-02-19 MED ORDER — CYCLOBENZAPRINE HCL 10 MG PO TABS: 10.0000 mg | ORAL_TABLET | Freq: Three times a day (TID) | ORAL | Status: DC | PRN

## 2014-02-19 MED ORDER — TRAMADOL HCL 50 MG PO TABS: 50.0000 mg | ORAL_TABLET | Freq: Three times a day (TID) | ORAL | Status: DC | PRN

## 2014-02-19 NOTE — Progress Notes (Signed)
Patient ID: [name], female   DOB: [birth year], 43 y.o.   MRN: [id number]   [name], is a 43 y.o. female  [id number]:[id number]  [id number]  DOB - 04/22/1971  Chief Complaint  Patient presents with  . Follow-up        Subjective:   [name] is a 43 y.o. female here today for a follow up visit. Pt is here following up on her HTN.  Pt reports pulling her back 5 days ago and she is now in pain. Pt has chronic pain in her left leg because she broke it in 2012. She claims compliant with medications for hypertension and high triglyceride. She continues to smoke cigarette about half a pack per day, she does not drink alcohol. Patient has No headache, No chest pain, No abdominal pain - No Nausea, No new weakness tingling or numbness, No Cough - SOB.  Problem  High Triglycerides  Low Back Pain    ALLERGIES: Allergies  Allergen Reactions  . Codeine Itching    PAST MEDICAL HISTORY: Past Medical History  Diagnosis Date  . Asthma   . Hypertension     MEDICATIONS AT HOME: Prior to Admission medications   Medication Sig Start Date End Date Taking? Authorizing Provider  albuterol (PROVENTIL HFA;VENTOLIN HFA) 108 (90 BASE) MCG/ACT inhaler Inhale 2 puffs into the lungs every 6 (six) hours as needed for wheezing or shortness of breath. 08/12/13  Yes Jeanann Lewandowsky, MD  gemfibrozil (LOPID) 600 MG tablet Take 1 tablet (600 mg total) by mouth 2 (two) times daily before a meal. 11/21/13  Yes Jeanann Lewandowsky, MD  lisinopril (PRINIVIL,ZESTRIL) 20 MG tablet Take 1 tablet (20 mg total) by mouth daily. 08/27/13  Yes Dorothea Ogle, MD  Vitamin D, Ergocalciferol, (DRISDOL) 50000 UNITS CAPS capsule Take 1 capsule (50,000 Units total) by mouth every 7 (seven) days. 08/27/13  Yes Dorothea Ogle, MD  cyclobenzaprine (FLEXERIL) 10 MG tablet Take 1 tablet (10 mg total) by mouth 3 (three) times daily as needed for muscle spasms. 02/19/14   Jeanann Lewandowsky, MD  traMADol (ULTRAM) 50 MG tablet Take  1 tablet (50 mg total) by mouth every 8 (eight) hours as needed. 02/19/14   Jeanann Lewandowsky, MD     Objective:   Filed Vitals:   02/19/14 1443  BP: 131/91  Pulse: 90  Temp: 98.2 F (36.8 C)  TempSrc: Oral  Resp: 14  Height: 5\' 4"  (1.626 m)  Weight: 175 lb (79.379 kg)  SpO2: 92%    Exam General appearance : Awake, alert, not in any distress. Speech Clear. Not toxic looking HEENT: Atraumatic and Normocephalic, pupils equally reactive to light and accomodation Neck: supple, no JVD. No cervical lymphadenopathy.  Chest:Good air entry bilaterally, no added sounds  CVS: S1 S2 regular, no murmurs.  Abdomen: Bowel sounds present, Non tender and not distended with no gaurding, rigidity or rebound. Extremities: B/L Lower Ext shows no edema, both legs are warm to touch Neurology: Awake alert, and oriented X 3, CN II-XII intact, Non focal Skin:No Rash Wounds:N/A  Data Review No results found for this basename: HGBA1C     Assessment & Plan   1. Essential hypertension, benign Continue lisinopril 20 mg tablet by mouth daily DASH diet  2. High triglycerides Continue gemfibrozil Low-fat low-cholesterol diet  3. Low back pain  - traMADol (ULTRAM) 50 MG tablet; Take 1 tablet (50 mg total) by mouth every 8 (eight) hours as needed.  Dispense: 30 tablet; Refill: 0 - cyclobenzaprine (FLEXERIL)  10 MG tablet; Take 1 tablet (10 mg total) by mouth 3 (three) times daily as needed for muscle spasms.  Dispense: 30 tablet; Refill: 0  Patient was counseled extensively about nutrition and exercise Patient was counseled extensively on smoking cessation  Return in about 6 months (around 08/21/2014) for Follow up HTN, Follow up Pain and comorbidities.  The patient was given clear instructions to go to ER or return to medical center if symptoms don't improve, worsen or new problems develop. The patient verbalized understanding. The patient was told to call to get lab results if they haven't heard  anything in the next week.   This note has been created with Education officer, environmentalDragon speech recognition software and smart phrase technology. Any transcriptional errors are unintentional.    Jeanann LewandowskyJEGEDE, Arrin Pintor, MD, MHA, FACP, FAAP [facility] [place], [place] [phone]   02/19/2014, 3:15 PM

## 2014-02-19 NOTE — Patient Instructions (Signed)

## 2014-02-19 NOTE — Progress Notes (Signed)
Pt is here following up on her HTN. Pt reports pulling her back 5 days ago and she is now in pain. Pt has chronic pain in her left leg because she broke it in 2012.

## 2014-07-13 ENCOUNTER — Encounter: Payer: Self-pay | Admitting: Internal Medicine

## 2014-08-20 ENCOUNTER — Other Ambulatory Visit: Payer: Self-pay | Admitting: Internal Medicine

## 2014-08-24 ENCOUNTER — Ambulatory Visit: Payer: Self-pay | Admitting: Internal Medicine

## 2014-09-07 ENCOUNTER — Ambulatory Visit: Payer: Self-pay | Attending: Internal Medicine | Admitting: Internal Medicine

## 2014-09-07 ENCOUNTER — Encounter: Payer: Self-pay | Admitting: Internal Medicine

## 2014-09-07 VITALS — BP 154/84 | HR 75 | Temp 98.3°F | Resp 16 | Ht 64.0 in | Wt 178.0 lb

## 2014-09-07 DIAGNOSIS — J4530 Mild persistent asthma, uncomplicated: Secondary | ICD-10-CM

## 2014-09-07 DIAGNOSIS — F17200 Nicotine dependence, unspecified, uncomplicated: Secondary | ICD-10-CM

## 2014-09-07 DIAGNOSIS — I10 Essential (primary) hypertension: Secondary | ICD-10-CM

## 2014-09-07 DIAGNOSIS — J45909 Unspecified asthma, uncomplicated: Secondary | ICD-10-CM | POA: Insufficient documentation

## 2014-09-07 DIAGNOSIS — N9489 Other specified conditions associated with female genital organs and menstrual cycle: Secondary | ICD-10-CM

## 2014-09-07 DIAGNOSIS — Z79899 Other long term (current) drug therapy: Secondary | ICD-10-CM | POA: Insufficient documentation

## 2014-09-07 DIAGNOSIS — N898 Other specified noninflammatory disorders of vagina: Secondary | ICD-10-CM | POA: Insufficient documentation

## 2014-09-07 DIAGNOSIS — Z0000 Encounter for general adult medical examination without abnormal findings: Secondary | ICD-10-CM

## 2014-09-07 DIAGNOSIS — Z720 Tobacco use: Secondary | ICD-10-CM

## 2014-09-07 DIAGNOSIS — F17210 Nicotine dependence, cigarettes, uncomplicated: Secondary | ICD-10-CM | POA: Insufficient documentation

## 2014-09-07 DIAGNOSIS — A630 Anogenital (venereal) warts: Secondary | ICD-10-CM | POA: Insufficient documentation

## 2014-09-07 LAB — LIPID PANEL
CHOL/HDL RATIO: 4.6 ratio
CHOLESTEROL: 192 mg/dL (ref 0–200)
HDL: 42 mg/dL (ref >39)
LDL Cholesterol: 117 mg/dL — ABNORMAL HIGH (ref 0–99)
Triglycerides: 166 mg/dL — ABNORMAL HIGH (ref <150)
VLDL: 33 mg/dL (ref 0–40)

## 2014-09-07 LAB — COMPLETE METABOLIC PANEL WITH GFR
ALK PHOS: 64 U/L (ref 39–117)
ALT: 19 U/L (ref 0–35)
AST: 23 U/L (ref 0–37)
Albumin: 4.2 g/dL (ref 3.5–5.2)
BUN: 12 mg/dL (ref 6–23)
CO2: 29 mEq/L (ref 19–32)
Calcium: 9.1 mg/dL (ref 8.4–10.5)
Chloride: 102 mEq/L (ref 96–112)
Creat: 0.64 mg/dL (ref 0.50–1.10)
GFR, Est African American: >89 mL/min
Glucose, Bld: 89 mg/dL (ref 70–99)
Potassium: 4.2 mEq/L (ref 3.5–5.3)
Sodium: 140 mEq/L (ref 135–145)
Total Bilirubin: 0.4 mg/dL (ref 0.2–1.2)
Total Protein: 6.6 g/dL (ref 6.0–8.3)

## 2014-09-07 MED ORDER — ALBUTEROL SULFATE HFA 108 (90 BASE) MCG/ACT IN AERS: 2.0000 | INHALATION_SPRAY | Freq: Four times a day (QID) | RESPIRATORY_TRACT | Status: DC | PRN

## 2014-09-07 MED ORDER — METRONIDAZOLE 1 % EX GEL: Freq: Every day | CUTANEOUS | Status: DC

## 2014-09-07 MED ORDER — FLUCONAZOLE 200 MG PO TABS: 200.0000 mg | ORAL_TABLET | Freq: Once | ORAL | Status: DC

## 2014-09-07 NOTE — Patient Instructions (Signed)
DASH Eating Plan DASH stands for "Dietary Approaches to Stop Hypertension." The DASH eating plan is a healthy eating plan that has been shown to reduce high blood pressure (hypertension). Additional health benefits may include reducing the risk of type 2 diabetes mellitus, heart disease, and stroke. The DASH eating plan may also help with weight loss. WHAT DO I NEED TO KNOW ABOUT THE DASH EATING PLAN? For the DASH eating plan, you will follow these general guidelines:  Choose foods with a percent daily value for sodium of less than 5% (as listed on the food label).  Use salt-free seasonings or herbs instead of table salt or sea salt.  Check with your health care provider or pharmacist before using salt substitutes.  Eat lower-sodium products, often labeled as "lower sodium" or "no salt added."  Eat fresh foods.  Eat more vegetables, fruits, and low-fat dairy products.  Choose whole grains. Look for the word "whole" as the first word in the ingredient list.  Choose fish and skinless chicken or turkey more often than red meat. Limit fish, poultry, and meat to 6 oz (170 g) each day.  Limit sweets, desserts, sugars, and sugary drinks.  Choose heart-healthy fats.  Limit cheese to 1 oz (28 g) per day.  Eat more home-cooked food and less restaurant, buffet, and fast food.  Limit fried foods.  Cook foods using methods other than frying.  Limit canned vegetables. If you do use them, rinse them well to decrease the sodium.  When eating at a restaurant, ask that your food be prepared with less salt, or no salt if possible. WHAT FOODS CAN I EAT? Seek help from a dietitian for individual calorie needs. Grains Whole grain or whole wheat bread. Brown rice. Whole grain or whole wheat pasta. Quinoa, bulgur, and whole grain cereals. Low-sodium cereals. Corn or whole wheat flour tortillas. Whole grain cornbread. Whole grain crackers. Low-sodium crackers. Vegetables Fresh or frozen vegetables  (raw, steamed, roasted, or grilled). Low-sodium or reduced-sodium tomato and vegetable juices. Low-sodium or reduced-sodium tomato sauce and paste. Low-sodium or reduced-sodium canned vegetables.  Fruits All fresh, canned (in natural juice), or frozen fruits. Meat and Other Protein Products Ground beef (85% or leaner), grass-fed beef, or beef trimmed of fat. Skinless chicken or turkey. Ground chicken or turkey. Pork trimmed of fat. All fish and seafood. Eggs. Dried beans, peas, or lentils. Unsalted nuts and seeds. Unsalted canned beans. Dairy Low-fat dairy products, such as skim or 1% milk, 2% or reduced-fat cheeses, low-fat ricotta or cottage cheese, or plain low-fat yogurt. Low-sodium or reduced-sodium cheeses. Fats and Oils Tub margarines without trans fats. Light or reduced-fat mayonnaise and salad dressings (reduced sodium). Avocado. Safflower, olive, or canola oils. Natural peanut or almond butter. Other Unsalted popcorn and pretzels. The items listed above may not be a complete list of recommended foods or beverages. Contact your dietitian for more options. WHAT FOODS ARE NOT RECOMMENDED? Grains White bread. White pasta. White rice. Refined cornbread. Bagels and croissants. Crackers that contain trans fat. Vegetables Creamed or fried vegetables. Vegetables in a cheese sauce. Regular canned vegetables. Regular canned tomato sauce and paste. Regular tomato and vegetable juices. Fruits Dried fruits. Canned fruit in light or heavy syrup. Fruit juice. Meat and Other Protein Products Fatty cuts of meat. Ribs, chicken wings, bacon, sausage, bologna, salami, chitterlings, fatback, hot dogs, bratwurst, and packaged luncheon meats. Salted nuts and seeds. Canned beans with salt. Dairy Whole or 2% milk, cream, half-and-half, and cream cheese. Whole-fat or sweetened yogurt. Full-fat   cheeses or blue cheese. Nondairy creamers and whipped toppings. Processed cheese, cheese spreads, or cheese  curds. Condiments Onion and garlic salt, seasoned salt, table salt, and sea salt. Canned and packaged gravies. Worcestershire sauce. Tartar sauce. Barbecue sauce. Teriyaki sauce. Soy sauce, including reduced sodium. Steak sauce. Fish sauce. Oyster sauce. Cocktail sauce. Horseradish. Ketchup and mustard. Meat flavorings and tenderizers. Bouillon cubes. Hot sauce. Tabasco sauce. Marinades. Taco seasonings. Relishes. Fats and Oils Butter, stick margarine, lard, shortening, ghee, and bacon fat. Coconut, palm kernel, or palm oils. Regular salad dressings. Other Pickles and olives. Salted popcorn and pretzels. The items listed above may not be a complete list of foods and beverages to avoid. Contact your dietitian for more information. WHERE CAN I FIND MORE INFORMATION? National Heart, Lung, and Blood Institute: www.nhlbi.nih.gov/health/health-topics/topics/dash/ Document Released: 08/17/2011 Document Revised: 01/12/2014 Document Reviewed: 07/02/2013 ExitCare Patient Information 2015 [employer]. This information is not intended to replace advice given to you by your health care provider. Make sure you discuss any questions you have with your health care provider. Hypertension Hypertension, commonly called high blood pressure, is when the force of blood pumping through your arteries is too strong. Your arteries are the blood vessels that carry blood from your heart throughout your body. A blood pressure reading consists of a higher number over a lower number, such as 110/72. The higher number (systolic) is the pressure inside your arteries when your heart pumps. The lower number (diastolic) is the pressure inside your arteries when your heart relaxes. Ideally you want your blood pressure below 120/80. Hypertension forces your heart to work harder to pump blood. Your arteries may become narrow or stiff. Having hypertension puts you at risk for heart disease, stroke, and other problems.  RISK  FACTORS Some risk factors for high blood pressure are controllable. Others are not.  Risk factors you cannot control include:   Race. You may be at higher risk if you are African American.  Age. Risk increases with age.  Gender. Men are at higher risk than women before age 45 years. After age 65, women are at higher risk than men. Risk factors you can control include:  Not getting enough exercise or physical activity.  Being overweight.  Getting too much fat, sugar, calories, or salt in your diet.  Drinking too much alcohol. SIGNS AND SYMPTOMS Hypertension does not usually cause signs or symptoms. Extremely high blood pressure (hypertensive crisis) may cause headache, anxiety, shortness of breath, and nosebleed. DIAGNOSIS  To check if you have hypertension, your health care provider will measure your blood pressure while you are seated, with your arm held at the level of your heart. It should be measured at least twice using the same arm. Certain conditions can cause a difference in blood pressure between your right and left arms. A blood pressure reading that is higher than normal on one occasion does not mean that you need treatment. If one blood pressure reading is high, ask your health care provider about having it checked again. TREATMENT  Treating high blood pressure includes making lifestyle changes and possibly taking medicine. Living a healthy lifestyle can help lower high blood pressure. You may need to change some of your habits. Lifestyle changes may include:  Following the DASH diet. This diet is high in fruits, vegetables, and whole grains. It is low in salt, red meat, and added sugars.  Getting at least 2 hours of brisk physical activity every week.  Losing weight if necessary.  Not smoking.  Limiting   alcoholic beverages.  Learning ways to reduce stress. If lifestyle changes are not enough to get your blood pressure under control, your health care provider may  prescribe medicine. You may need to take more than one. Work closely with your health care provider to understand the risks and benefits. HOME CARE INSTRUCTIONS  Have your blood pressure rechecked as directed by your health care provider.   Take medicines only as directed by your health care provider. Follow the directions carefully. Blood pressure medicines must be taken as prescribed. The medicine does not work as well when you skip doses. Skipping doses also puts you at risk for problems.   Do not smoke.   Monitor your blood pressure at home as directed by your health care provider. SEEK MEDICAL CARE IF:   You think you are having a reaction to medicines taken.  You have recurrent headaches or feel dizzy.  You have swelling in your ankles.  You have trouble with your vision. SEEK IMMEDIATE MEDICAL CARE IF:  You develop a severe headache or confusion.  You have unusual weakness, numbness, or feel faint.  You have severe chest or abdominal pain.  You vomit repeatedly.  You have trouble breathing. MAKE SURE YOU:   Understand these instructions.  Will watch your condition.  Will get help right away if you are not doing well or get worse. Document Released: 08/28/2005 Document Revised: 01/12/2014 Document Reviewed: 06/20/2013 ExitCare Patient Information 2015 [employer]. This information is not intended to replace advice given to you by your health care provider. Make sure you discuss any questions you have with your health care provider.  

## 2014-09-07 NOTE — Progress Notes (Signed)
Pt comes in with c/o 8 mnths vaginal fishy odor without d/c or pain [facility] Noted for BV in past  Requesting Metro gel

## 2014-09-07 NOTE — Progress Notes (Signed)
Patient ID: Deborah CureSherri R [name], female   DOB: 08/19/1971, 43 y.o.   MRN: [id number]   [name], is a 43 y.o. female  [id number]  [id number]  DOB - 03/05/1971  Chief Complaint  Patient presents with  . Follow-up  . Gynecologic Exam        Subjective:   [name] is a 43 y.o. female here today for a follow up visit. Patient is known to have hypertension on lisinopril 20 mg tablet by mouth daily, asthma on albuterol inhaler, previous laser surgery for genital warts here today because of persistent foul smelling and minimal discharges per vaginum. She described odor as offensive, and no one else can perceive the odor. She had been seen twice: once by a gynecologist and second at the public health department, all tests have been negative for sexually transmitted disease or any other infection but patient said the malodor persists. She is not sexually active at the moment, last sexual intercourse was February 2015. She denies any lower abdominal pain, no urinary symptoms, no change in bowel habits, no nausea or vomiting, no fever. Patient smokes about half a pack of cigarettes per day, she drinks alcohol occasionally. Patient has No headache, No chest pain, No abdominal pain - No Nausea, No new weakness tingling or numbness, No Cough - SOB. Patient also needs refill of her albuterol.  No problems updated.  ALLERGIES: Allergies  Allergen Reactions  . Codeine Itching    PAST MEDICAL HISTORY: Past Medical History  Diagnosis Date  . Asthma   . Hypertension     MEDICATIONS AT HOME: Prior to Admission medications   Medication Sig Start Date End Date Taking? Authorizing Provider  albuterol (PROVENTIL HFA;VENTOLIN HFA) 108 (90 BASE) MCG/ACT inhaler Inhale 2 puffs into the lungs every 6 (six) hours as needed for wheezing or shortness of breath. 09/07/14  Yes Quentin Angstlugbemiga E Niccolas Loeper, MD  gemfibrozil (LOPID) 600 MG tablet Take 1 tablet (600 mg total) by mouth 2 (two) times daily before a  meal. 11/21/13  Yes Layal Javid E Hyman HopesJegede, MD  lisinopril (PRINIVIL,ZESTRIL) 20 MG tablet Take 1 tablet (20 mg total) by mouth daily. 08/27/13  Yes Dorothea OgleIskra M Myers, MD  Vitamin D, Ergocalciferol, (DRISDOL) 50000 UNITS CAPS capsule Take 1 capsule (50,000 Units total) by mouth every 7 (seven) days. 08/27/13  Yes Dorothea OgleIskra M Myers, MD  fluconazole (DIFLUCAN) 200 MG tablet Take 1 tablet (200 mg total) by mouth once. 09/07/14   Quentin Angstlugbemiga E Chonte Ricke, MD  metroNIDAZOLE (METROGEL) 1 % gel Apply topically daily. 09/07/14   Quentin Angstlugbemiga E Mareta Chesnut, MD     Objective:   Filed Vitals:   09/07/14 1154  BP: 154/84  Pulse: 75  Temp: 98.3 F (36.8 C)  TempSrc: Oral  Resp: 16  Height: 5\' 4"  (1.626 m)  Weight: 178 lb (80.74 kg)  SpO2: 96%    Exam General appearance : Awake, alert, not in any distress. Speech Clear. Not toxic looking HEENT: Atraumatic and Normocephalic, pupils equally reactive to light and accomodation Neck: supple, no JVD. No cervical lymphadenopathy.  Chest:Good air entry bilaterally, mild bilateral wheezes, no crepitations CVS: S1 S2 regular, no murmurs.  Abdomen: Bowel sounds present, Non tender and not distended with no gaurding, rigidity or rebound. Extremities: B/L Lower Ext shows no edema, both legs are warm to touch Neurology: Awake alert, and oriented X 3, CN II-XII intact, Non focal Pelvic examination: Normal female external genitalia, genital warts in the introitus, central cervix, no contact bleeding, no foul odor, minimal discharge,  negative cervical motion tenderness.  Data Review No results found for: HGBA1C   Assessment & Plan   1. Essential hypertension, benign  Patient claims her blood pressure is controlled at home, but her blood pressure today may be because of anxiety due to her symptoms We'll continue lisinopril 20 mg tablet by mouth daily for now, patient is to come in for blood pressure check in about 2 weeks. She is also encouraged to measure ambulatory blood  pressure and keep a record for next appointment.  - COMPLETE METABOLIC PANEL WITH GFR - Lipid panel - DASH diet   2. Preventative health care  - Cytology - PAP - Cervicovaginal ancillary only - HIV antibody (with reflex)  3. Smoking Patient was counseled extensively about smoking cessation  4. Asthma, chronic, mild persistent, uncomplicated  - albuterol (PROVENTIL HFA;VENTOLIN HFA) 108 (90 BASE) MCG/ACT inhaler; Inhale 2 puffs into the lungs every 6 (six) hours as needed for wheezing or shortness of breath.  Dispense: 3 Inhaler; Refill: 3  5. Vaginal odor We will empirically treat for bacterial vaginosis and vaginal candidiasis. - fluconazole (DIFLUCAN) 200 MG tablet; Take 1 tablet (200 mg total) by mouth once.  Dispense: 1 tablet; Refill: 1 - metroNIDAZOLE (METROGEL) 1 % gel; Apply topically daily.  Dispense: 60 g; Refill: 3   Patient was counseled extensively about nutrition and exercise   Return in about 6 months (around 03/09/2015), or if symptoms worsen or fail to improve, for Follow up HTN, Routine Follow Up, Follow up Pain and comorbidities.  The patient was given clear instructions to go to ER or return to medical center if symptoms don't improve, worsen or new problems develop. The patient verbalized understanding. The patient was told to call to get lab results if they haven't heard anything in the next week.   This note has been created with Education officer, environmentalDragon speech recognition software and smart phrase technology. Any transcriptional errors are unintentional.    Jeanann LewandowskyJEGEDE, Tuyen Uncapher, MD, MHA, FACP, FAAP [facility] [place], [place] [phone]   09/07/2014, 12:36 PM

## 2014-09-08 LAB — CERVICOVAGINAL ANCILLARY ONLY
Chlamydia: NEGATIVE
NEISSERIA GONORRHEA: NEGATIVE
WET PREP (BD AFFIRM): POSITIVE — AB
Wet Prep (BD Affirm): NEGATIVE
Wet Prep (BD Affirm): NEGATIVE

## 2014-09-08 LAB — HIV ANTIBODY (ROUTINE TESTING W REFLEX): HIV 1&2 Ab, 4th Generation: NONREACTIVE

## 2014-09-08 LAB — CYTOLOGY - PAP

## 2014-09-22 ENCOUNTER — Telehealth: Payer: Self-pay | Admitting: Internal Medicine

## 2014-09-22 NOTE — Telephone Encounter (Signed)
Patient called and requested to speak to nurse. Please f/u with pt. °

## 2014-09-23 NOTE — Telephone Encounter (Signed)
Pt stated feeling sick. Advised to come in tomorrow for walking clinic Between 9-11 and 2-4. Pt stated she be here tomorrow

## 2014-09-24 ENCOUNTER — Encounter: Payer: Self-pay | Admitting: Internal Medicine

## 2014-09-24 ENCOUNTER — Ambulatory Visit: Payer: Self-pay | Attending: Internal Medicine | Admitting: Internal Medicine

## 2014-09-24 VITALS — BP 158/96 | HR 77 | Temp 98.0°F | Resp 16 | Wt 180.4 lb

## 2014-09-24 DIAGNOSIS — J069 Acute upper respiratory infection, unspecified: Secondary | ICD-10-CM

## 2014-09-24 DIAGNOSIS — I10 Essential (primary) hypertension: Secondary | ICD-10-CM | POA: Insufficient documentation

## 2014-09-24 DIAGNOSIS — J45909 Unspecified asthma, uncomplicated: Secondary | ICD-10-CM | POA: Insufficient documentation

## 2014-09-24 DIAGNOSIS — Z792 Long term (current) use of antibiotics: Secondary | ICD-10-CM | POA: Insufficient documentation

## 2014-09-24 MED ORDER — LEVOFLOXACIN 750 MG PO TABS: 750.0000 mg | ORAL_TABLET | Freq: Every day | ORAL | Status: DC

## 2014-09-24 MED ORDER — HYDROCOD POLST-CHLORPHEN POLST 10-8 MG/5ML PO LQCR: 5.0000 mL | Freq: Every evening | ORAL | Status: DC | PRN

## 2014-09-24 NOTE — Progress Notes (Signed)
Patient ID: [name], female   DOB: 12/17/1970, 44 y.o.   MRN: [id number]   [name], is a 44 y.o. female  [id number]  [id number]  DOB - 01/17/1971  Chief Complaint  Patient presents with  . Cough        Subjective:   [name] is a 44 y.o. female here today for a follow up visit. Patient has medical history significant for hypertension and asthma came into clinic today with complaint of cough for about 2 weeks productive of yellowish sputum. She also has associated headache because of constant coughing.  She denies shortness of breath , no orthopnea. She has tried all over-the-counter medications with no relief. Patient has No chest pain, No abdominal pain - No Nausea, No new weakness tingling or numbness.  Problem  Acute Upper Respiratory Infection    ALLERGIES: Allergies  Allergen Reactions  . Codeine Itching    PAST MEDICAL HISTORY: Past Medical History  Diagnosis Date  . Asthma   . Hypertension     MEDICATIONS AT HOME: Prior to Admission medications   Medication Sig Start Date End Date Taking? Authorizing Provider  albuterol (PROVENTIL HFA;VENTOLIN HFA) 108 (90 BASE) MCG/ACT inhaler Inhale 2 puffs into the lungs every 6 (six) hours as needed for wheezing or shortness of breath. 09/07/14   Quentin Angstlugbemiga E Latiana Tomei, MD  chlorpheniramine-HYDROcodone (TUSSIONEX PENNKINETIC ER) 10-8 MG/5ML LQCR Take 5 mLs by mouth at bedtime as needed for cough. 09/24/14   Quentin Angstlugbemiga E Jaqwon Manfred, MD  fluconazole (DIFLUCAN) 200 MG tablet Take 1 tablet (200 mg total) by mouth once. 09/07/14   Quentin Angstlugbemiga E Taraoluwa Thakur, MD  gemfibrozil (LOPID) 600 MG tablet Take 1 tablet (600 mg total) by mouth 2 (two) times daily before a meal. 11/21/13   Quentin Angstlugbemiga E Marilouise Densmore, MD  levofloxacin (LEVAQUIN) 750 MG tablet Take 1 tablet (750 mg total) by mouth daily. 09/24/14   Quentin Angstlugbemiga E Kavi Almquist, MD  lisinopril (PRINIVIL,ZESTRIL) 20 MG tablet Take 1 tablet (20 mg total) by mouth daily. 08/27/13   Dorothea OgleIskra M  Myers, MD  metroNIDAZOLE (METROGEL) 1 % gel Apply topically daily. 09/07/14   Quentin Angstlugbemiga E Irvan Tiedt, MD  Vitamin D, Ergocalciferol, (DRISDOL) 50000 UNITS CAPS capsule Take 1 capsule (50,000 Units total) by mouth every 7 (seven) days. 08/27/13   Dorothea OgleIskra M Myers, MD     Objective:   Filed Vitals:   09/24/14 1719  BP: 158/96  Pulse: 77  Temp: 98 F (36.7 C)  Resp: 16  Weight: 180 lb 6.4 oz (81.829 kg)  SpO2: 100%    Exam General appearance : Awake, alert, not in any distress. Speech Clear. Not toxic looking HEENT: Atraumatic and Normocephalic, pupils equally reactive to light and accomodation Neck: supple, no JVD. No cervical lymphadenopathy.  Chest:Good air entry bilaterally, no added sounds  CVS: S1 S2 regular, no murmurs.  Abdomen: Bowel sounds present, Non tender and not distended with no gaurding, rigidity or rebound. Extremities: B/L Lower Ext shows no edema, both legs are warm to touch Neurology: Awake alert, and oriented X 3, CN II-XII intact, Non focal Skin:No Rash Wounds:N/A  Data Review No results found for: HGBA1C   Assessment & Plan   1. Acute upper respiratory infection  - DG Chest 2 View; Future  - levofloxacin (LEVAQUIN) 750 MG tablet; Take 1 tablet (750 mg total) by mouth daily.  Dispense: 7 tablet; Refill: 0 - chlorpheniramine-HYDROcodone (TUSSIONEX PENNKINETIC ER) 10-8 MG/5ML LQCR; Take 5 mLs by mouth at bedtime as needed for cough.  Dispense:  1 Bottle; Refill: 0  Patient was educated about symptomatic treatment  Return in about 6 months (around 03/25/2015), or if symptoms worsen or fail to improve, for Follow up HTN, COPD.  The patient was given clear instructions to go to ER or return to medical center if symptoms don't improve, worsen or new problems develop. The patient verbalized understanding. The patient was told to call to get lab results if they haven't heard anything in the next week.   This note has been created with Furniture conservator/restorer. Any transcriptional errors are unintentional.    Jeanann Lewandowsky, MD, MHA, FACP, FAAP [facility] [place], [place] [phone]   09/24/2014, 5:40 PM

## 2014-09-24 NOTE — Patient Instructions (Signed)
Upper Respiratory Infection, Adult An upper respiratory infection (URI) is also sometimes known as the common cold. The upper respiratory tract includes the nose, sinuses, throat, trachea, and bronchi. Bronchi are the airways leading to the lungs. Most people improve within 1 week, but symptoms can last up to 2 weeks. A residual cough may last even longer.  CAUSES Many different viruses can infect the tissues lining the upper respiratory tract. The tissues become irritated and inflamed and often become very moist. Mucus production is also common. A cold is contagious. You can easily spread the virus to others by oral contact. This includes kissing, sharing a glass, coughing, or sneezing. Touching your mouth or nose and then touching a surface, which is then touched by another person, can also spread the virus. SYMPTOMS  Symptoms typically develop 1 to 3 days after you come in contact with a cold virus. Symptoms vary from person to person. They may include:  Runny nose.  Sneezing.  Nasal congestion.  Sinus irritation.  Sore throat.  Loss of voice (laryngitis).  Cough.  Fatigue.  Muscle aches.  Loss of appetite.  Headache.  Low-grade fever. DIAGNOSIS  You might diagnose your own cold based on familiar symptoms, since most people get a cold 2 to 3 times a year. Your caregiver can confirm this based on your exam. Most importantly, your caregiver can check that your symptoms are not due to another disease such as strep throat, sinusitis, pneumonia, asthma, or epiglottitis. Blood tests, throat tests, and X-rays are not necessary to diagnose a common cold, but they may sometimes be helpful in excluding other more serious diseases. Your caregiver will decide if any further tests are required. RISKS AND COMPLICATIONS  You may be at risk for a more severe case of the common cold if you smoke cigarettes, have chronic heart disease (such as heart failure) or lung disease (such as asthma), or if  you have a weakened immune system. The very young and very old are also at risk for more serious infections. Bacterial sinusitis, middle ear infections, and bacterial pneumonia can complicate the common cold. The common cold can worsen asthma and chronic obstructive pulmonary disease (COPD). Sometimes, these complications can require emergency medical care and may be life-threatening. PREVENTION  The best way to protect against getting a cold is to practice good hygiene. Avoid oral or hand contact with people with cold symptoms. Wash your hands often if contact occurs. There is no clear evidence that vitamin C, vitamin E, echinacea, or exercise reduces the chance of developing a cold. However, it is always recommended to get plenty of rest and practice good nutrition. TREATMENT  Treatment is directed at relieving symptoms. There is no cure. Antibiotics are not effective, because the infection is caused by a virus, not by bacteria. Treatment may include:  Increased fluid intake. Sports drinks offer valuable electrolytes, sugars, and fluids.  Breathing heated mist or steam (vaporizer or shower).  Eating chicken soup or other clear broths, and maintaining good nutrition.  Getting plenty of rest.  Using gargles or lozenges for comfort.  Controlling fevers with ibuprofen or acetaminophen as directed by your caregiver.  Increasing usage of your inhaler if you have asthma. Zinc gel and zinc lozenges, taken in the first 24 hours of the common cold, can shorten the duration and lessen the severity of symptoms. Pain medicines may help with fever, muscle aches, and throat pain. A variety of non-prescription medicines are available to treat congestion and runny nose. Your caregiver   can make recommendations and may suggest nasal or lung inhalers for other symptoms.  HOME CARE INSTRUCTIONS   Only take over-the-counter or prescription medicines for pain, discomfort, or fever as directed by your  caregiver.  Use a warm mist humidifier or inhale steam from a shower to increase air moisture. This may keep secretions moist and make it easier to breathe.  Drink enough water and fluids to keep your urine clear or pale yellow.  Rest as needed.  Return to work when your temperature has returned to normal or as your caregiver advises. You may need to stay home longer to avoid infecting others. You can also use a face mask and careful hand washing to prevent spread of the virus. SEEK MEDICAL CARE IF:   After the first few days, you feel you are getting worse rather than better.  You need your caregiver's advice about medicines to control symptoms.  You develop chills, worsening shortness of breath, or brown or red sputum. These may be signs of pneumonia.  You develop yellow or brown nasal discharge or pain in the face, especially when you bend forward. These may be signs of sinusitis.  You develop a fever, swollen neck glands, pain with swallowing, or white areas in the back of your throat. These may be signs of strep throat. SEEK IMMEDIATE MEDICAL CARE IF:   You have a fever.  You develop severe or persistent headache, ear pain, sinus pain, or chest pain.  You develop wheezing, a prolonged cough, cough up blood, or have a change in your usual mucus (if you have chronic lung disease).  You develop sore muscles or a stiff neck. Document Released: 02/21/2001 Document Revised: 11/20/2011 Document Reviewed: 12/03/2013 ExitCare Patient Information 2015 [employer]. This information is not intended to replace advice given to you by your health care provider. Make sure you discuss any questions you have with your health care provider.  

## 2014-09-24 NOTE — Progress Notes (Signed)
Patient complains of cough that started approximately two weeks ago and a headache From constant coughing Patient has tried OTC medications with no relief

## 2014-09-29 ENCOUNTER — Telehealth: Payer: Self-pay | Admitting: Emergency Medicine

## 2014-09-29 NOTE — Telephone Encounter (Signed)
Pt given results and medication instructions

## 2014-09-29 NOTE — Telephone Encounter (Signed)
-----   Message from Quentin Angstlugbemiga E Jegede, MD sent at 09/16/2014  8:58 AM EST ----- Please inform patient that her Pap smear cytology shows no evidence of malignancy.

## 2014-10-05 ENCOUNTER — Ambulatory Visit: Payer: Self-pay | Attending: Internal Medicine

## 2014-10-11 ENCOUNTER — Emergency Department (HOSPITAL_COMMUNITY)
Admission: EM | Admit: 2014-10-11 | Discharge: 2014-10-11 | Disposition: A | Discharge status: Home / Self Care | Payer: Self-pay | Attending: Emergency Medicine | Admitting: Emergency Medicine

## 2014-10-11 ENCOUNTER — Encounter (HOSPITAL_COMMUNITY): Payer: Self-pay | Admitting: Cardiology

## 2014-10-11 DIAGNOSIS — Z79899 Other long term (current) drug therapy: Secondary | ICD-10-CM | POA: Insufficient documentation

## 2014-10-11 DIAGNOSIS — I10 Essential (primary) hypertension: Secondary | ICD-10-CM | POA: Insufficient documentation

## 2014-10-11 DIAGNOSIS — M7989 Other specified soft tissue disorders: Secondary | ICD-10-CM | POA: Insufficient documentation

## 2014-10-11 DIAGNOSIS — Z792 Long term (current) use of antibiotics: Secondary | ICD-10-CM | POA: Insufficient documentation

## 2014-10-11 DIAGNOSIS — Z720 Tobacco use: Secondary | ICD-10-CM | POA: Insufficient documentation

## 2014-10-11 DIAGNOSIS — R609 Edema, unspecified: Secondary | ICD-10-CM

## 2014-10-11 DIAGNOSIS — J45909 Unspecified asthma, uncomplicated: Secondary | ICD-10-CM | POA: Insufficient documentation

## 2014-10-11 LAB — COMPREHENSIVE METABOLIC PANEL
ALT: 14 U/L (ref 0–35)
AST: 22 U/L (ref 0–37)
Albumin: 3.8 g/dL (ref 3.5–5.2)
Alkaline Phosphatase: 61 U/L (ref 39–117)
Anion gap: 9 (ref 5–15)
BUN: 7 mg/dL (ref 6–23)
CO2: 26 mmol/L (ref 19–32)
Calcium: 9.3 mg/dL (ref 8.4–10.5)
Chloride: 101 mmol/L (ref 96–112)
Creatinine, Ser: 0.72 mg/dL (ref 0.50–1.10)
GFR calc Af Amer: >90 mL/min (ref >90)
GFR calc non Af Amer: >90 mL/min (ref >90)
Glucose, Bld: 113 mg/dL — ABNORMAL HIGH (ref 70–99)
Potassium: 3.8 mmol/L (ref 3.5–5.1)
Sodium: 136 mmol/L (ref 135–145)
Total Bilirubin: 0.6 mg/dL (ref 0.3–1.2)
Total Protein: 6.6 g/dL (ref 6.0–8.3)

## 2014-10-11 LAB — CBC WITH DIFFERENTIAL/PLATELET
Basophils Absolute: 0 10*3/uL (ref 0.0–0.1)
Basophils Relative: 0 % (ref 0–1)
Eosinophils Absolute: 0.1 10*3/uL (ref 0.0–0.7)
Eosinophils Relative: 1 % (ref 0–5)
HCT: 45.3 % (ref 36.0–46.0)
Hemoglobin: 15.4 g/dL — ABNORMAL HIGH (ref 12.0–15.0)
Lymphocytes Relative: 28 % (ref 12–46)
Lymphs Abs: 2.2 10*3/uL (ref 0.7–4.0)
MCH: 33.2 pg (ref 26.0–34.0)
MCHC: 34 g/dL (ref 30.0–36.0)
MCV: 97.6 fL (ref 78.0–100.0)
Monocytes Absolute: 0.4 10*3/uL (ref 0.1–1.0)
Monocytes Relative: 5 % (ref 3–12)
Neutro Abs: 5.1 10*3/uL (ref 1.7–7.7)
Neutrophils Relative %: 66 % (ref 43–77)
Platelets: 255 10*3/uL (ref 150–400)
RBC: 4.64 MIL/uL (ref 3.87–5.11)
RDW: 13.6 % (ref 11.5–15.5)
WBC: 7.7 10*3/uL (ref 4.0–10.5)

## 2014-10-11 MED ORDER — FUROSEMIDE 20 MG PO TABS: 20.0000 mg | ORAL_TABLET | Freq: Every day | ORAL | Status: DC

## 2014-10-11 NOTE — ED Notes (Signed)
Pt reports redness and swelling to the left leg that started about 2 weeks ago. States she was placed on levaquin for URI and the swelling started after that. Calf is tender palpation

## 2014-10-11 NOTE — ED Provider Notes (Signed)
CSN: [id number]     Arrival date & time 10/11/14  1325 History   First MD Initiated Contact with Patient 10/11/14 1557     Chief Complaint  Patient presents with  . Foot Pain  . Leg Swelling      HPI Pt c/o L leg pain x 2 weeks. Reports pain started after taking Levaquin. Has been off of Levaquin x 1 week, reports increased swelling that doesn't resolve. At baseline, pt has mild swelling from previous trauma 3 years ago, reports swelling has not improved with rest. Obvious swelling and generalized edema noted below L knee. Past Medical History  Diagnosis Date  . Asthma   . Hypertension    Past Surgical History  Procedure Laterality Date  . Fracture surgery      8/12,9/12  . Foot fracture surgery      pins due to "too much movement"  . External fixation leg  09/26/2011    Procedure: EXTERNAL FIXATION LEG;  Surgeon: Budd Palmer, MD;  Location: [facility];  Service: Orthopedics;  Laterality: Left;   Removal of Hardware Left Tibia with application wound vac   Family History  Problem Relation Age of Onset  . Hypertension Mother   . Cancer Mother   . Hypertension Father   . Diabetes Father   . Heart disease Father   . Stroke Father   . Cancer Father     bladder cancer    History  Substance Use Topics  . Smoking status: Current Every Day Smoker -- 0.50 packs/day for 25 years    Types: Cigarettes  . Smokeless tobacco: Never Used  . Alcohol Use: 0.0 oz/week    1-2 Cans of beer per week   OB History    Gravida Para Term Preterm AB TAB SAB Ectopic Multiple Living   0 1 1 0 0 0 1     Review of Systems  All other systems reviewed and are negative  Allergies  Codeine  Home Medications   Prior to Admission medications   Medication Sig Start Date End Date Taking? Authorizing Provider  albuterol (PROVENTIL HFA;VENTOLIN HFA) 108 (90 BASE) MCG/ACT inhaler Inhale 2 puffs into the lungs every 6 (six) hours as needed for wheezing or shortness of breath. 09/07/14    Quentin Angst, MD  chlorpheniramine-HYDROcodone (TUSSIONEX PENNKINETIC ER) 10-8 MG/5ML LQCR Take 5 mLs by mouth at bedtime as needed for cough. 09/24/14   Quentin Angst, MD  fluconazole (DIFLUCAN) 200 MG tablet Take 1 tablet (200 mg total) by mouth once. 09/07/14   Quentin Angst, MD  furosemide (LASIX) 20 MG tablet Take 1 tablet (20 mg total) by mouth daily. 10/11/14   Nelia Shi, MD  gemfibrozil (LOPID) 600 MG tablet Take 1 tablet (600 mg total) by mouth 2 (two) times daily before a meal. 11/21/13   Quentin Angst, MD  levofloxacin (LEVAQUIN) 750 MG tablet Take 1 tablet (750 mg total) by mouth daily. 09/24/14   Quentin Angst, MD  lisinopril (PRINIVIL,ZESTRIL) 20 MG tablet Take 1 tablet (20 mg total) by mouth daily. 08/27/13   Dorothea Ogle, MD  metroNIDAZOLE (METROGEL) 1 % gel Apply topically daily. 09/07/14   Quentin Angst, MD  Vitamin D, Ergocalciferol, (DRISDOL) 50000 UNITS CAPS capsule Take 1 capsule (50,000 Units total) by mouth every 7 (seven) days. 08/27/13   Dorothea Ogle, MD   BP 156/102 mmHg  Pulse 84  Temp(Src) 98.3 F (36.8 C)  Resp 16  SpO2 99% Physical Exam  Constitutional: She is oriented to person, place, and time. She appears well-developed and well-nourished. No distress.  HENT:  Head: Normocephalic and atraumatic.  Eyes: Pupils are equal, round, and reactive to light.  Neck: Normal range of motion.  Cardiovascular: Normal rate and intact distal pulses.   Pulmonary/Chest: No respiratory distress.  Abdominal: Normal appearance. She exhibits no distension.  Musculoskeletal:       Left lower leg: She exhibits swelling and edema. She exhibits no tenderness and no deformity.  Neurological: She is alert and oriented to person, place, and time. No cranial nerve deficit.  Skin: Skin is warm and dry. No rash noted.  Psychiatric: She has a normal mood and affect. Her behavior is normal.  Nursing note and vitals reviewed.   ED Course   Procedures (including critical care time) Labs Review Labs Reviewed  CBC WITH DIFFERENTIAL/PLATELET - Abnormal; Notable for the following:    Hemoglobin 15.4 (*)    All other components within normal limits  COMPREHENSIVE METABOLIC PANEL - Abnormal; Notable for the following:    Glucose, Bld 113 (*)    All other components within normal limits    Imaging Review Preliminary report from venous Doppler showed no deep venous thrombosis   MDM  I suspect she is having venous insufficiency because of the significant trauma in the past.  Encouraged to continue with compression stockings.  We'll try a short course of Lasix.  Instructed to return should condition worsen. Final diagnoses:  Leg swelling        Nelia Shiobert L Adela Esteban, MD 10/11/14 [phone]

## 2014-10-11 NOTE — ED Notes (Signed)
US at bedside

## 2014-10-11 NOTE — Progress Notes (Signed)
VASCULAR LAB PRELIMINARY  PRELIMINARY  PRELIMINARY  PRELIMINARY  Left lower extremity venous Doppler completed.    Preliminary report:  There is no DVT or SVT noted in the left lower extremity.   Javiel Canepa, RVT 10/11/2014, 4:22 PM

## 2014-10-11 NOTE — ED Notes (Addendum)
Pt c/o L leg pain x 2 weeks. Reports pain started after taking Levaquin. Has been off of Levaquin x 1 week, reports increased swelling that doesn't resolve. At baseline, pt has mild swelling from previous trauma 3 years ago, reports swelling has not improved with rest.  Obvious swelling and generalized edema noted below L knee.

## 2014-10-11 NOTE — Discharge Instructions (Signed)

## 2014-12-08 ENCOUNTER — Other Ambulatory Visit: Payer: Self-pay | Admitting: Internal Medicine

## 2014-12-15 ENCOUNTER — Other Ambulatory Visit: Payer: Self-pay | Admitting: Internal Medicine

## 2015-02-12 ENCOUNTER — Emergency Department (HOSPITAL_COMMUNITY)
Admission: EM | Admit: 2015-02-12 | Discharge: 2015-02-12 | Disposition: A | Discharge status: Home / Self Care | Payer: Self-pay | Attending: Emergency Medicine | Admitting: Emergency Medicine

## 2015-02-12 ENCOUNTER — Emergency Department (HOSPITAL_COMMUNITY): Payer: Self-pay

## 2015-02-12 ENCOUNTER — Encounter (HOSPITAL_COMMUNITY): Payer: Self-pay | Admitting: Emergency Medicine

## 2015-02-12 DIAGNOSIS — I10 Essential (primary) hypertension: Secondary | ICD-10-CM | POA: Insufficient documentation

## 2015-02-12 DIAGNOSIS — Z720 Tobacco use: Secondary | ICD-10-CM | POA: Insufficient documentation

## 2015-02-12 DIAGNOSIS — S0012XA Contusion of left eyelid and periocular area, initial encounter: Secondary | ICD-10-CM | POA: Insufficient documentation

## 2015-02-12 DIAGNOSIS — S3992XA Unspecified injury of lower back, initial encounter: Secondary | ICD-10-CM | POA: Insufficient documentation

## 2015-02-12 DIAGNOSIS — J45909 Unspecified asthma, uncomplicated: Secondary | ICD-10-CM | POA: Insufficient documentation

## 2015-02-12 DIAGNOSIS — S0083XA Contusion of other part of head, initial encounter: Secondary | ICD-10-CM | POA: Insufficient documentation

## 2015-02-12 DIAGNOSIS — S022XXA Fracture of nasal bones, initial encounter for closed fracture: Secondary | ICD-10-CM | POA: Insufficient documentation

## 2015-02-12 DIAGNOSIS — Y998 Other external cause status: Secondary | ICD-10-CM | POA: Insufficient documentation

## 2015-02-12 DIAGNOSIS — S2232XA Fracture of one rib, left side, initial encounter for closed fracture: Secondary | ICD-10-CM | POA: Insufficient documentation

## 2015-02-12 DIAGNOSIS — Z792 Long term (current) use of antibiotics: Secondary | ICD-10-CM | POA: Insufficient documentation

## 2015-02-12 DIAGNOSIS — Z79899 Other long term (current) drug therapy: Secondary | ICD-10-CM | POA: Insufficient documentation

## 2015-02-12 DIAGNOSIS — Y9389 Activity, other specified: Secondary | ICD-10-CM | POA: Insufficient documentation

## 2015-02-12 DIAGNOSIS — Y9289 Other specified places as the place of occurrence of the external cause: Secondary | ICD-10-CM | POA: Insufficient documentation

## 2015-02-12 LAB — URINE MICROSCOPIC-ADD ON

## 2015-02-12 LAB — URINALYSIS, ROUTINE W REFLEX MICROSCOPIC
Bilirubin Urine: NEGATIVE
Glucose, UA: NEGATIVE mg/dL
Ketones, ur: NEGATIVE mg/dL
Leukocytes, UA: NEGATIVE
Nitrite: NEGATIVE
Protein, ur: NEGATIVE mg/dL
Specific Gravity, Urine: 1.02 (ref 1.005–1.030)
Urobilinogen, UA: 0.2 mg/dL (ref 0.0–1.0)
pH: 5.5 (ref 5.0–8.0)

## 2015-02-12 LAB — POC URINE PREG, ED: Preg Test, Ur: NEGATIVE

## 2015-02-12 MED ORDER — OXYCODONE-ACETAMINOPHEN 5-325 MG PO TABS: 2.0000 | ORAL_TABLET | ORAL | Status: DC | PRN

## 2015-02-12 MED ORDER — DIPHENHYDRAMINE HCL 25 MG PO CAPS
25.0000 mg | ORAL_CAPSULE | Freq: Once | ORAL | Status: AC
  Administered 2015-02-12: 25 mg via ORAL
  Filled 2015-02-12: qty 1

## 2015-02-12 MED ORDER — OXYCODONE-ACETAMINOPHEN 5-325 MG PO TABS
1.0000 | ORAL_TABLET | Freq: Once | ORAL | Status: AC
  Administered 2015-02-12: 1 via ORAL
  Filled 2015-02-12: qty 1
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
## 2015-02-12 NOTE — Discharge Instructions (Signed)
°  Please monitor for new or worsening signs or symptoms, please refer above information. Please follow up immediately if he experiencing any concerning signs or symptoms. Please follow-up with your primary care provider for reevaluation of symptoms persist. We used pain medication only as directed, avoid drinking or driving while taking.
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
## 2015-02-15 ENCOUNTER — Emergency Department (HOSPITAL_COMMUNITY)
Admission: EM | Admit: 2015-02-15 | Discharge: 2015-02-15 | Disposition: A | Discharge status: Home / Self Care | Payer: Self-pay | Attending: Emergency Medicine | Admitting: Emergency Medicine

## 2015-02-15 ENCOUNTER — Emergency Department (HOSPITAL_COMMUNITY): Payer: Self-pay

## 2015-02-15 DIAGNOSIS — Z720 Tobacco use: Secondary | ICD-10-CM | POA: Insufficient documentation

## 2015-02-15 DIAGNOSIS — J45901 Unspecified asthma with (acute) exacerbation: Secondary | ICD-10-CM | POA: Insufficient documentation

## 2015-02-15 DIAGNOSIS — J9801 Acute bronchospasm: Secondary | ICD-10-CM

## 2015-02-15 DIAGNOSIS — J069 Acute upper respiratory infection, unspecified: Secondary | ICD-10-CM | POA: Insufficient documentation

## 2015-02-15 DIAGNOSIS — I10 Essential (primary) hypertension: Secondary | ICD-10-CM | POA: Insufficient documentation

## 2015-02-15 DIAGNOSIS — Z792 Long term (current) use of antibiotics: Secondary | ICD-10-CM | POA: Insufficient documentation

## 2015-02-15 DIAGNOSIS — Z79899 Other long term (current) drug therapy: Secondary | ICD-10-CM | POA: Insufficient documentation

## 2015-02-15 LAB — CBC
HEMATOCRIT: 43.7 % (ref 36.0–46.0)
HEMOGLOBIN: 14.9 g/dL (ref 12.0–15.0)
MCH: 33.3 pg (ref 26.0–34.0)
MCHC: 34.1 g/dL (ref 30.0–36.0)
MCV: 97.8 fL (ref 78.0–100.0)
Platelets: 227 10*3/uL (ref 150–400)
RBC: 4.47 MIL/uL (ref 3.87–5.11)
RDW: 13.8 % (ref 11.5–15.5)
WBC: 9.3 10*3/uL (ref 4.0–10.5)

## 2015-02-15 LAB — BASIC METABOLIC PANEL
Anion gap: 10 (ref 5–15)
BUN: 15 mg/dL (ref 6–20)
CHLORIDE: 97 mmol/L — AB (ref 101–111)
CO2: 26 mmol/L (ref 22–32)
Calcium: 9.2 mg/dL (ref 8.9–10.3)
Creatinine, Ser: 0.47 mg/dL (ref 0.44–1.00)
GFR calc Af Amer: >60 mL/min (ref >60)
GFR calc non Af Amer: >60 mL/min (ref >60)
GLUCOSE: 111 mg/dL — AB (ref 65–99)
POTASSIUM: 3.8 mmol/L (ref 3.5–5.1)
SODIUM: 133 mmol/L — AB (ref 135–145)

## 2015-02-15 LAB — I-STAT TROPONIN, ED: Troponin i, poc: 0 ng/mL (ref 0.00–0.08)

## 2015-02-15 LAB — BRAIN NATRIURETIC PEPTIDE: B NATRIURETIC PEPTIDE 5: 12.5 pg/mL (ref 0.0–100.0)

## 2015-02-15 MED ORDER — METHYLPREDNISOLONE SODIUM SUCC 125 MG IJ SOLR
125.0000 mg | Freq: Once | INTRAMUSCULAR | Status: AC
Start: 2015-02-15 — End: 2015-02-15
  Administered 2015-02-15: 125 mg via INTRAVENOUS
  Filled 2015-02-15: qty 2

## 2015-02-15 MED ORDER — ALBUTEROL SULFATE HFA 108 (90 BASE) MCG/ACT IN AERS
2.0000 | INHALATION_SPRAY | Freq: Once | RESPIRATORY_TRACT | Status: AC
  Administered 2015-02-15: 2 via RESPIRATORY_TRACT
  Filled 2015-02-15: qty 6.7

## 2015-02-15 MED ORDER — FENTANYL CITRATE (PF) 100 MCG/2ML IJ SOLN
100.0000 ug | Freq: Once | INTRAMUSCULAR | Status: AC
  Administered 2015-02-15: 100 ug via INTRAVENOUS
  Filled 2015-02-15: qty 2

## 2015-02-15 MED ORDER — IPRATROPIUM-ALBUTEROL 0.5-2.5 (3) MG/3ML IN SOLN
3.0000 mL | Freq: Once | RESPIRATORY_TRACT | Status: AC
  Administered 2015-02-15: 3 mL via RESPIRATORY_TRACT
  Filled 2015-02-15: qty 3

## 2015-02-15 MED ORDER — ALBUTEROL SULFATE HFA 108 (90 BASE) MCG/ACT IN AERS: 2.0000 | INHALATION_SPRAY | RESPIRATORY_TRACT | Status: DC | PRN

## 2015-02-15 MED ORDER — PREDNISONE 20 MG PO TABS: 60.0000 mg | ORAL_TABLET | Freq: Every day | ORAL | Status: AC
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
## 2015-02-15 NOTE — Discharge Instructions (Signed)
Asthma, Acute Bronchospasm Acute bronchospasm caused by asthma is also referred to as an asthma attack. Bronchospasm means your air passages become narrowed. The narrowing is caused by inflammation and tightening of the muscles in the air tubes (bronchi) in your lungs. This can make it hard to breathe or cause you to wheeze and cough. CAUSES Possible triggers are:  Animal dander from the skin, hair, or feathers of animals.  Dust mites contained in house dust.  Cockroaches.  Pollen from trees or grass.  Mold.  Cigarette or tobacco smoke.  Air pollutants such as dust, household cleaners, hair sprays, aerosol sprays, paint fumes, strong chemicals, or strong odors.  Cold air or weather changes. Cold air may trigger inflammation. Winds increase molds and pollens in the air.  Strong emotions such as crying or laughing hard.  Stress.  Certain medicines such as aspirin or beta-blockers.  Sulfites in foods and drinks, such as dried fruits and wine.  Infections or inflammatory conditions, such as a flu, cold, or inflammation of the nasal membranes (rhinitis).  Gastroesophageal reflux disease (GERD). GERD is a condition where stomach acid backs up into your esophagus.  Exercise or strenuous activity. SIGNS AND SYMPTOMS   Wheezing.  Excessive coughing, particularly at night.  Chest tightness.  Shortness of breath. DIAGNOSIS  Your health care provider will ask you about your medical history and perform a physical exam. A chest X-ray or blood testing may be performed to look for other causes of your symptoms or other conditions that may have triggered your asthma attack. TREATMENT  Treatment is aimed at reducing inflammation and opening up the airways in your lungs. Most asthma attacks are treated with inhaled medicines. These include quick relief or rescue medicines (such as bronchodilators) and controller medicines (such as inhaled corticosteroids). These medicines are sometimes  given through an inhaler or a nebulizer. Systemic steroid medicine taken by mouth or given through an IV tube also can be used to reduce the inflammation when an attack is moderate or severe. Antibiotic medicines are only used if a bacterial infection is present.  HOME CARE INSTRUCTIONS  1. Rest. 2. Drink plenty of liquids. This helps the mucus to remain thin and be easily coughed up. Only use caffeine in moderation and do not use alcohol until you have recovered from your illness. 3. Do not smoke. Avoid being exposed to secondhand smoke. 4. You play a critical role in keeping yourself in good health. Avoid exposure to things that cause you to wheeze or to have breathing problems. 5. Keep your medicines up-to-date and available. Carefully follow your health care provider's treatment plan. 6. Take your medicine exactly as prescribed. 7. When pollen or pollution is bad, keep windows closed and use an air conditioner or go to places with air conditioning. 8. Asthma requires careful medical care. See your health care provider for a follow-up as advised. If you are more than [redacted] weeks pregnant and you were prescribed any new medicines, let your obstetrician know about the visit and how you are doing. Follow up with your health care provider as directed. 9. After you have recovered from your asthma attack, make an appointment with your outpatient doctor to talk about ways to reduce the likelihood of future attacks. If you do not have a doctor who manages your asthma, make an appointment with a primary care doctor to discuss your asthma. SEEK IMMEDIATE MEDICAL CARE IF:   You are getting worse.  You have trouble breathing. If severe, call your local  emergency services (911 in the [place]  You develop chest pain or discomfort.  You are vomiting.  You are not able to keep fluids down.  You are coughing up yellow, green, brown, or bloody sputum.  You have a fever and your symptoms suddenly get  worse.  You have trouble swallowing. MAKE SURE YOU:   Understand these instructions.  Will watch your condition.  Will get help right away if you are not doing well or get worse. Document Released: 12/13/2006 Document Revised: 09/02/2013 Document Reviewed: 03/05/2013 [facility] Patient Information 2015 [employer]. This information is not intended to replace advice given to you by your health care provider. Make sure you discuss any questions you have with your health care provider.    Incentive Spirometer An incentive spirometer is a tool that can help keep your lungs clear and active. This tool measures how well you are filling your lungs with each breath. Taking long, deep breaths may help reverse or decrease the chance of developing breathing (pulmonary) problems (especially infection) following:  Surgery of the chest or abdomen.  Surgery if you have a history of smoking or a lung problem.  A long period of time when you are unable to move or be active. BEFORE THE PROCEDURE   If the spirometer includes an indicator to show your best effort, your nurse or respiratory therapist will set it to a desired goal.  If possible, sit up straight or lean slightly forward. Try not to slouch.  Hold the incentive spirometer in an upright position. INSTRUCTIONS FOR USE  10. Sit on the edge of your bed if possible, or sit up as far as you can in bed or on a chair. 11. Hold the incentive spirometer in an upright position. 12. Breathe out normally. 13. Place the mouthpiece in your mouth and seal your lips tightly around it. 14. Breathe in slowly and as deeply as possible, raising the piston or the ball toward the top of the column. 15. Hold your breath for 3-5 seconds or for as long as possible. Allow the piston or ball to fall to the bottom of the column. 16. Remove the mouthpiece from your mouth and breathe out normally. 17. Rest for a few seconds and repeat Steps 1 through 7 at least 10  times every 1-2 hours when you are awake. Take your time and take a few normal breaths between deep breaths. 18. The spirometer may include an indicator to show your best effort. Use the indicator as a goal to work toward during each repetition. 19. After each set of 10 deep breaths, practice coughing to be sure your lungs are clear. If you have an incision (the cut made at the time of surgery), support your incision when coughing by placing a pillow or rolled-up towels firmly against it. Once you are able to get out of bed, walk around indoors and cough well. You may stop using the incentive spirometer when instructed by your caregiver.  RISKS AND COMPLICATIONS  Breathing too quickly may cause dizziness. At an extreme, this could cause you to pass out. Take your time so you do not get dizzy or light-headed.  If you are in pain, you may need to take or ask for pain medication before doing incentive spirometry. It is harder to take a deep breath if you are having pain. AFTER USE  Rest and breathe slowly and easily.  It can be helpful to keep a log of your progress. Your caregiver can provide you  with a simple table to help with this. If you are using the spirometer at home, follow these instructions: SEEK MEDICAL CARE IF:   You are having difficultly using the spirometer.  You have trouble using the spirometer as often as instructed.  Your pain medication is not giving enough relief while using the spirometer.  You develop fever of 100.51F (38.1C) or higher. SEEK IMMEDIATE MEDICAL CARE IF:   You cough up bloody sputum that had not been present before.  You develop fever of 102F (38.9C) or greater.  You develop worsening pain at or near the incision site. MAKE SURE YOU:   Understand these instructions.  Will watch your condition.  Will get help right away if you are not doing well or get worse. Document Released: 01/08/2007 Document Revised: 01/12/2014 Document Reviewed:  03/11/2007 [facility] Patient Information 2015 [place], [place]. This information is not intended to replace advice given to you by your health care provider. Make sure you discuss any questions you have with your health care provider.

## 2015-02-15 NOTE — ED Notes (Signed)
Pt education given on incentive spirometer

## 2015-02-15 NOTE — ED Notes (Signed)
Pulse Ox maintained at 94-95% on room air while ambulating. Pt states she feels much better

## 2015-02-18 ENCOUNTER — Encounter (HOSPITAL_COMMUNITY): Payer: Self-pay | Admitting: Emergency Medicine

## 2015-02-18 ENCOUNTER — Emergency Department (HOSPITAL_COMMUNITY)
Admission: EM | Admit: 2015-02-18 | Discharge: 2015-02-18 | Disposition: A | Discharge status: Home / Self Care | Payer: Self-pay | Attending: Emergency Medicine | Admitting: Emergency Medicine

## 2015-02-18 DIAGNOSIS — Z720 Tobacco use: Secondary | ICD-10-CM | POA: Insufficient documentation

## 2015-02-18 DIAGNOSIS — R0789 Other chest pain: Secondary | ICD-10-CM | POA: Insufficient documentation

## 2015-02-18 DIAGNOSIS — I10 Essential (primary) hypertension: Secondary | ICD-10-CM | POA: Insufficient documentation

## 2015-02-18 DIAGNOSIS — Z7952 Long term (current) use of systemic steroids: Secondary | ICD-10-CM | POA: Insufficient documentation

## 2015-02-18 DIAGNOSIS — Z79899 Other long term (current) drug therapy: Secondary | ICD-10-CM | POA: Insufficient documentation

## 2015-02-18 DIAGNOSIS — J45901 Unspecified asthma with (acute) exacerbation: Secondary | ICD-10-CM | POA: Insufficient documentation

## 2015-02-18 DIAGNOSIS — Z8781 Personal history of (healed) traumatic fracture: Secondary | ICD-10-CM | POA: Insufficient documentation

## 2015-02-18 MED ORDER — OXYCODONE-ACETAMINOPHEN 5-325 MG PO TABS: 1.0000 | ORAL_TABLET | Freq: Four times a day (QID) | ORAL | Status: DC | PRN
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
## 2015-02-18 NOTE — Discharge Instructions (Signed)
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
## 2015-03-08 ENCOUNTER — Encounter: Payer: Self-pay | Admitting: Internal Medicine

## 2015-03-08 ENCOUNTER — Ambulatory Visit: Payer: Self-pay | Attending: Internal Medicine | Admitting: Internal Medicine

## 2015-03-08 ENCOUNTER — Other Ambulatory Visit: Payer: Self-pay

## 2015-03-08 VITALS — BP 158/89 | HR 86 | Temp 98.0°F | Resp 16 | Wt 176.2 lb

## 2015-03-08 DIAGNOSIS — I10 Essential (primary) hypertension: Secondary | ICD-10-CM | POA: Insufficient documentation

## 2015-03-08 DIAGNOSIS — R05 Cough: Secondary | ICD-10-CM | POA: Insufficient documentation

## 2015-03-08 DIAGNOSIS — N899 Noninflammatory disorder of vagina, unspecified: Secondary | ICD-10-CM | POA: Insufficient documentation

## 2015-03-08 DIAGNOSIS — IMO0001 Reserved for inherently not codable concepts without codable children: Secondary | ICD-10-CM | POA: Insufficient documentation

## 2015-03-08 DIAGNOSIS — R030 Elevated blood-pressure reading, without diagnosis of hypertension: Secondary | ICD-10-CM

## 2015-03-08 DIAGNOSIS — Z0000 Encounter for general adult medical examination without abnormal findings: Secondary | ICD-10-CM | POA: Insufficient documentation

## 2015-03-08 DIAGNOSIS — S2232XD Fracture of one rib, left side, subsequent encounter for fracture with routine healing: Secondary | ICD-10-CM | POA: Insufficient documentation

## 2015-03-08 DIAGNOSIS — S2232XS Fracture of one rib, left side, sequela: Secondary | ICD-10-CM

## 2015-03-08 DIAGNOSIS — N9489 Other specified conditions associated with female genital organs and menstrual cycle: Secondary | ICD-10-CM

## 2015-03-08 DIAGNOSIS — N898 Other specified noninflammatory disorders of vagina: Secondary | ICD-10-CM

## 2015-03-08 DIAGNOSIS — S2232XA Fracture of one rib, left side, initial encounter for closed fracture: Secondary | ICD-10-CM | POA: Insufficient documentation

## 2015-03-08 DIAGNOSIS — F17210 Nicotine dependence, cigarettes, uncomplicated: Secondary | ICD-10-CM | POA: Insufficient documentation

## 2015-03-08 DIAGNOSIS — J45909 Unspecified asthma, uncomplicated: Secondary | ICD-10-CM | POA: Insufficient documentation

## 2015-03-08 LAB — POCT GLYCOSYLATED HEMOGLOBIN (HGB A1C): Hemoglobin A1C: 5.4

## 2015-03-08 MED ORDER — LISINOPRIL 20 MG PO TABS: 20.0000 mg | ORAL_TABLET | Freq: Every day | ORAL | Status: DC

## 2015-03-08 MED ORDER — ACETAMINOPHEN-CODEINE #3 300-30 MG PO TABS: 1.0000 | ORAL_TABLET | ORAL | Status: DC | PRN

## 2015-03-08 MED ORDER — VITAMIN D (ERGOCALCIFEROL) 1.25 MG (50000 UNIT) PO CAPS: 50000.0000 [IU] | ORAL_CAPSULE | ORAL | Status: DC

## 2015-03-08 MED ORDER — METRONIDAZOLE 1 % EX GEL: Freq: Every day | CUTANEOUS | Status: DC

## 2015-03-08 MED ORDER — METRONIDAZOLE 0.75 % VA GEL: 1.0000 | Freq: Two times a day (BID) | VAGINAL | Status: DC

## 2015-03-08 MED ORDER — CLONIDINE HCL 0.1 MG PO TABS
0.1000 mg | ORAL_TABLET | Freq: Once | ORAL | Status: AC
  Administered 2015-03-08: 0.1 mg via ORAL

## 2015-03-08 NOTE — Patient Instructions (Signed)
DASH Eating Plan DASH stands for "Dietary Approaches to Stop Hypertension." The DASH eating plan is a healthy eating plan that has been shown to reduce high blood pressure (hypertension). Additional health benefits may include reducing the risk of type 2 diabetes mellitus, heart disease, and stroke. The DASH eating plan may also help with weight loss. WHAT DO I NEED TO KNOW ABOUT THE DASH EATING PLAN? For the DASH eating plan, you will follow these general guidelines:  Choose foods with a percent daily value for sodium of less than 5% (as listed on the food label).  Use salt-free seasonings or herbs instead of table salt or sea salt.  Check with your health care provider or pharmacist before using salt substitutes.  Eat lower-sodium products, often labeled as "lower sodium" or "no salt added."  Eat fresh foods.  Eat more vegetables, fruits, and low-fat dairy products.  Choose whole grains. Look for the word "whole" as the first word in the ingredient list.  Choose fish and skinless chicken or turkey more often than red meat. Limit fish, poultry, and meat to 6 oz (170 g) each day.  Limit sweets, desserts, sugars, and sugary drinks.  Choose heart-healthy fats.  Limit cheese to 1 oz (28 g) per day.  Eat more home-cooked food and less restaurant, buffet, and fast food.  Limit fried foods.  Cook foods using methods other than frying.  Limit canned vegetables. If you do use them, rinse them well to decrease the sodium.  When eating at a restaurant, ask that your food be prepared with less salt, or no salt if possible. WHAT FOODS CAN I EAT? Seek help from a dietitian for individual calorie needs. Grains Whole grain or whole wheat bread. Brown rice. Whole grain or whole wheat pasta. Quinoa, bulgur, and whole grain cereals. Low-sodium cereals. Corn or whole wheat flour tortillas. Whole grain cornbread. Whole grain crackers. Low-sodium crackers. Vegetables Fresh or frozen vegetables  (raw, steamed, roasted, or grilled). Low-sodium or reduced-sodium tomato and vegetable juices. Low-sodium or reduced-sodium tomato sauce and paste. Low-sodium or reduced-sodium canned vegetables.  Fruits All fresh, canned (in natural juice), or frozen fruits. Meat and Other Protein Products Ground beef (85% or leaner), grass-fed beef, or beef trimmed of fat. Skinless chicken or turkey. Ground chicken or turkey. Pork trimmed of fat. All fish and seafood. Eggs. Dried beans, peas, or lentils. Unsalted nuts and seeds. Unsalted canned beans. Dairy Low-fat dairy products, such as skim or 1% milk, 2% or reduced-fat cheeses, low-fat ricotta or cottage cheese, or plain low-fat yogurt. Low-sodium or reduced-sodium cheeses. Fats and Oils Tub margarines without trans fats. Light or reduced-fat mayonnaise and salad dressings (reduced sodium). Avocado. Safflower, olive, or canola oils. Natural peanut or almond butter. Other Unsalted popcorn and pretzels. The items listed above may not be a complete list of recommended foods or beverages. Contact your dietitian for more options. WHAT FOODS ARE NOT RECOMMENDED? Grains White bread. White pasta. White rice. Refined cornbread. Bagels and croissants. Crackers that contain trans fat. Vegetables Creamed or fried vegetables. Vegetables in a cheese sauce. Regular canned vegetables. Regular canned tomato sauce and paste. Regular tomato and vegetable juices. Fruits Dried fruits. Canned fruit in light or heavy syrup. Fruit juice. Meat and Other Protein Products Fatty cuts of meat. Ribs, chicken wings, bacon, sausage, bologna, salami, chitterlings, fatback, hot dogs, bratwurst, and packaged luncheon meats. Salted nuts and seeds. Canned beans with salt. Dairy Whole or 2% milk, cream, half-and-half, and cream cheese. Whole-fat or sweetened yogurt. Full-fat   cheeses or blue cheese. Nondairy creamers and whipped toppings. Processed cheese, cheese spreads, or cheese  curds. Condiments Onion and garlic salt, seasoned salt, table salt, and sea salt. Canned and packaged gravies. Worcestershire sauce. Tartar sauce. Barbecue sauce. Teriyaki sauce. Soy sauce, including reduced sodium. Steak sauce. Fish sauce. Oyster sauce. Cocktail sauce. Horseradish. Ketchup and mustard. Meat flavorings and tenderizers. Bouillon cubes. Hot sauce. Tabasco sauce. Marinades. Taco seasonings. Relishes. Fats and Oils Butter, stick margarine, lard, shortening, ghee, and bacon fat. Coconut, palm kernel, or palm oils. Regular salad dressings. Other Pickles and olives. Salted popcorn and pretzels. The items listed above may not be a complete list of foods and beverages to avoid. Contact your dietitian for more information. WHERE CAN I FIND MORE INFORMATION? National Heart, Lung, and Blood Institute: www.nhlbi.nih.gov/health/health-topics/topics/dash/ Document Released: 08/17/2011 Document Revised: 01/12/2014 Document Reviewed: 07/02/2013 ExitCare Patient Information 2015 [employer]. This information is not intended to replace advice given to you by your health care provider. Make sure you discuss any questions you have with your health care provider. Hypertension Hypertension, commonly called high blood pressure, is when the force of blood pumping through your arteries is too strong. Your arteries are the blood vessels that carry blood from your heart throughout your body. A blood pressure reading consists of a higher number over a lower number, such as 110/72. The higher number (systolic) is the pressure inside your arteries when your heart pumps. The lower number (diastolic) is the pressure inside your arteries when your heart relaxes. Ideally you want your blood pressure below 120/80. Hypertension forces your heart to work harder to pump blood. Your arteries may become narrow or stiff. Having hypertension puts you at risk for heart disease, stroke, and other problems.  RISK  FACTORS Some risk factors for high blood pressure are controllable. Others are not.  Risk factors you cannot control include:   Race. You may be at higher risk if you are African American.  Age. Risk increases with age.  Gender. Men are at higher risk than women before age 45 years. After age 65, women are at higher risk than men. Risk factors you can control include:  Not getting enough exercise or physical activity.  Being overweight.  Getting too much fat, sugar, calories, or salt in your diet.  Drinking too much alcohol. SIGNS AND SYMPTOMS Hypertension does not usually cause signs or symptoms. Extremely high blood pressure (hypertensive crisis) may cause headache, anxiety, shortness of breath, and nosebleed. DIAGNOSIS  To check if you have hypertension, your health care provider will measure your blood pressure while you are seated, with your arm held at the level of your heart. It should be measured at least twice using the same arm. Certain conditions can cause a difference in blood pressure between your right and left arms. A blood pressure reading that is higher than normal on one occasion does not mean that you need treatment. If one blood pressure reading is high, ask your health care provider about having it checked again. TREATMENT  Treating high blood pressure includes making lifestyle changes and possibly taking medicine. Living a healthy lifestyle can help lower high blood pressure. You may need to change some of your habits. Lifestyle changes may include:  Following the DASH diet. This diet is high in fruits, vegetables, and whole grains. It is low in salt, red meat, and added sugars.  Getting at least 2 hours of brisk physical activity every week.  Losing weight if necessary.  Not smoking.  Limiting   alcoholic beverages.  Learning ways to reduce stress. If lifestyle changes are not enough to get your blood pressure under control, your health care provider may  prescribe medicine. You may need to take more than one. Work closely with your health care provider to understand the risks and benefits. HOME CARE INSTRUCTIONS  Have your blood pressure rechecked as directed by your health care provider.   Take medicines only as directed by your health care provider. Follow the directions carefully. Blood pressure medicines must be taken as prescribed. The medicine does not work as well when you skip doses. Skipping doses also puts you at risk for problems.   Do not smoke.   Monitor your blood pressure at home as directed by your health care provider. SEEK MEDICAL CARE IF:   You think you are having a reaction to medicines taken.  You have recurrent headaches or feel dizzy.  You have swelling in your ankles.  You have trouble with your vision. SEEK IMMEDIATE MEDICAL CARE IF:  You develop a severe headache or confusion.  You have unusual weakness, numbness, or feel faint.  You have severe chest or abdominal pain.  You vomit repeatedly.  You have trouble breathing. MAKE SURE YOU:   Understand these instructions.  Will watch your condition.  Will get help right away if you are not doing well or get worse. Document Released: 08/28/2005 Document Revised: 01/12/2014 Document Reviewed: 06/20/2013 ExitCare Patient Information 2015 [employer]. This information is not intended to replace advice given to you by your health care provider. Make sure you discuss any questions you have with your health care provider.  
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
## 2015-04-05 ENCOUNTER — Ambulatory Visit: Payer: Self-pay | Attending: Internal Medicine

## 2015-06-07 ENCOUNTER — Ambulatory Visit: Payer: Self-pay | Admitting: Internal Medicine

## 2015-06-14 ENCOUNTER — Ambulatory Visit: Payer: Self-pay | Attending: Internal Medicine | Admitting: Internal Medicine

## 2015-06-14 ENCOUNTER — Encounter: Payer: Self-pay | Admitting: Internal Medicine

## 2015-06-14 VITALS — BP 144/100 | HR 70 | Temp 97.9°F | Resp 18 | Ht 64.0 in | Wt 170.0 lb

## 2015-06-14 DIAGNOSIS — J069 Acute upper respiratory infection, unspecified: Secondary | ICD-10-CM

## 2015-06-14 DIAGNOSIS — I10 Essential (primary) hypertension: Secondary | ICD-10-CM

## 2015-06-14 DIAGNOSIS — Z720 Tobacco use: Secondary | ICD-10-CM

## 2015-06-14 DIAGNOSIS — F17200 Nicotine dependence, unspecified, uncomplicated: Secondary | ICD-10-CM

## 2015-06-14 DIAGNOSIS — J441 Chronic obstructive pulmonary disease with (acute) exacerbation: Secondary | ICD-10-CM

## 2015-06-14 MED ORDER — VITAMIN D (ERGOCALCIFEROL) 1.25 MG (50000 UNIT) PO CAPS: 50000.0000 [IU] | ORAL_CAPSULE | ORAL | Status: DC

## 2015-06-14 MED ORDER — LISINOPRIL 20 MG PO TABS: 20.0000 mg | ORAL_TABLET | Freq: Every day | ORAL | Status: DC

## 2015-06-14 MED ORDER — PREDNISONE 20 MG PO TABS: 20.0000 mg | ORAL_TABLET | Freq: Every day | ORAL | Status: DC

## 2015-06-14 MED ORDER — GUAIFENESIN-DM 100-10 MG/5ML PO SYRP: 5.0000 mL | ORAL_SOLUTION | ORAL | Status: DC | PRN

## 2015-06-14 MED ORDER — ALBUTEROL SULFATE HFA 108 (90 BASE) MCG/ACT IN AERS: 2.0000 | INHALATION_SPRAY | RESPIRATORY_TRACT | Status: DC | PRN

## 2015-06-14 MED ORDER — BUDESONIDE-FORMOTEROL FUMARATE 160-4.5 MCG/ACT IN AERO: 2.0000 | INHALATION_SPRAY | Freq: Two times a day (BID) | RESPIRATORY_TRACT | Status: DC
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
## 2015-06-14 NOTE — Patient Instructions (Signed)

## 2015-06-14 NOTE — Progress Notes (Signed)
Patient ID: [name], female   DOB: 03/15/1971, 44 y.o.   MRN: [id number]   [name], is a 44 y.o. female  [id number]  [id number]  DOB - [birth year]  Chief Complaint  Patient presents with  . Follow-up        Subjective:   [name] is a 44 y.o. female here today for a follow up visit. Patient has medical history of hypertension, asthma, and nicotine dependence. Patient is here today complaining of cough that is productive of whitish sputum, associated with wheezing and nighttime cough that prevents her from sleeping requiring more frequent use of albuterol inhaler. Patient continues to smoke heavily about half to 1 pack of cigarettes per day, she has smoked for over 20 years. The patient recently lost her brother and has since then smoked more than complaining of anxiety and increasing blood pressure despite compliance with medication. Patient denies outright depression, she denies any suicidal ideation or thought but tearful during this encounter. Patient declines to see our licensed clinical social worker for counseling. She said "I will be okay". Patient has No headache, No chest pain, No abdominal pain - No Nausea, No new weakness tingling or numbness. No paroxysmal nocturnal dyspnea, no orthopnea, no leg swelling.  Problem  Essential Hypertension  Smoking  Copd Exacerbation (Hcc)    ALLERGIES: Allergies  Allergen Reactions  . Codeine Itching    PAST MEDICAL HISTORY: Past Medical History  Diagnosis Date  . Asthma   . Hypertension     MEDICATIONS AT HOME: Prior to Admission medications   Medication Sig Start Date End Date Taking? Authorizing Provider  albuterol (PROVENTIL HFA;VENTOLIN HFA) 108 (90 BASE) MCG/ACT inhaler Inhale 2 puffs into the lungs every 4 (four) hours as needed for wheezing or shortness of breath. 06/14/15  Yes Quentin Angst, MD  lisinopril (PRINIVIL,ZESTRIL) 20 MG tablet Take 1 tablet (20 mg total) by mouth daily. 06/14/15  Yes  Quentin Angst, MD  Vitamin D, Ergocalciferol, (DRISDOL) 50000 UNITS CAPS capsule Take 1 capsule (50,000 Units total) by mouth once a week. 06/14/15  Yes Quentin Angst, MD  acetaminophen-codeine (TYLENOL #3) 300-30 MG per tablet Take 1 tablet by mouth every 4 (four) hours as needed. Patient not taking: Reported on 06/14/2015 03/08/15   Quentin Angst, MD  budesonide-formoterol (SYMBICORT) 160-4.5 MCG/ACT inhaler Inhale 2 puffs into the lungs 2 (two) times daily. 06/14/15   Quentin Angst, MD  chlorpheniramine-HYDROcodone (TUSSIONEX PENNKINETIC ER) 10-8 MG/5ML LQCR Take 5 mLs by mouth at bedtime as needed for cough. Patient not taking: Reported on 02/15/2015 09/24/14   Quentin Angst, MD  guaiFENesin-dextromethorphan (ROBITUSSIN DM) 100-10 MG/5ML syrup Take 5 mLs by mouth every 4 (four) hours as needed for cough. 06/14/15   Quentin Angst, MD  metroNIDAZOLE (METROGEL VAGINAL) 0.75 % vaginal gel Place 1 Applicatorful vaginally 2 (two) times daily. Patient not taking: Reported on 06/14/2015 03/08/15   Quentin Angst, MD  metroNIDAZOLE (METROGEL) 1 % gel Apply topically daily. Patient not taking: Reported on 06/14/2015 03/08/15   Quentin Angst, MD  predniSONE (DELTASONE) 20 MG tablet Take 1 tablet (20 mg total) by mouth daily with breakfast. 06/14/15   Quentin Angst, MD     Objective:   Filed Vitals:   06/14/15 1054 06/14/15 1059 06/14/15 1110  BP:  150/86 144/100  Pulse:  70   Temp:  97.9 F (36.6 C)   TempSrc:  Oral   Resp:  18   Height: 5'  4" (1.626 m)    Weight: 170 lb (77.111 kg)    SpO2:  96%     Exam General appearance : Awake, alert, not in any distress. Speech Clear. Not toxic looking HEENT: Atraumatic and Normocephalic, pupils equally reactive to light and accomodation Neck: supple, no JVD. No cervical lymphadenopathy.  Chest: Scattered wheezes bilaterally, no crackles CVS: S1 S2 regular, no murmurs.  Abdomen: Bowel sounds present, Non  tender and not distended with no gaurding, rigidity or rebound. Extremities: B/L Lower Ext shows no edema, both legs are warm to touch Neurology: Awake alert, and oriented X 3, CN II-XII intact, Non focal Skin:No Rash  Data Review Lab Results  Component Value Date   HGBA1C 5.40 03/08/2015     Assessment & Plan   1. Essential hypertension  - lisinopril (PRINIVIL,ZESTRIL) 20 MG tablet; Take 1 tablet (20 mg total) by mouth daily.  Dispense: 90 tablet; Refill: 3 - Vitamin D, Ergocalciferol, (DRISDOL) 50000 UNITS CAPS capsule; Take 1 capsule (50,000 Units total) by mouth once a week.  Dispense: 30 capsule; Refill: 3  2. Smoking  Lisha was counseled on the dangers of tobacco use, and was advised to quit. Reviewed strategies to maximize success, including removing cigarettes and smoking materials from environment, stress management and support of family/friends.  3. Acute upper respiratory infection  - albuterol (PROVENTIL HFA;VENTOLIN HFA) 108 (90 BASE) MCG/ACT inhaler; Inhale 2 puffs into the lungs every 4 (four) hours as needed for wheezing or shortness of breath.  Dispense: 1 Inhaler; Refill: 1  4. COPD exacerbation (HCC)  - budesonide-formoterol (SYMBICORT) 160-4.5 MCG/ACT inhaler; Inhale 2 puffs into the lungs 2 (two) times daily.  Dispense: 1 Inhaler; Refill: 3 - predniSONE (DELTASONE) 20 MG tablet; Take 1 tablet (20 mg total) by mouth daily with breakfast.  Dispense: 5 tablet; Refill: 0 - guaiFENesin-dextromethorphan (ROBITUSSIN DM) 100-10 MG/5ML syrup; Take 5 mLs by mouth every 4 (four) hours as needed for cough.  Dispense: 480 mL; Refill: 3  Patient have been counseled extensively about nutrition and exercise  Return in about 3 months (around 09/14/2015) for Routine Follow Up, COPD.  The patient was given clear instructions to go to ER or return to medical center if symptoms don't improve, worsen or new problems develop. The patient verbalized understanding. The patient was  told to call to get lab results if they haven't heard anything in the next week.   This note has been created with Education officer, environmental. Any transcriptional errors are unintentional.    Jeanann Lewandowsky, MD, MHA, FACP, FAAP, CPE [facility] and [facility] [place], [place] [phone]   06/14/2015, 11:43 AM

## 2015-06-14 NOTE — Progress Notes (Signed)
Patient ID: [name], female   DOB: 04/16/1971, 44 y.o.   MRN: [id number]   [name], is a 44 y.o. female  QIO:[id number]  LKG:[id number]  DOB - Oct 27, 1970  CC:  Chief Complaint  Patient presents with  . Follow-up       HPI: [name] is a 44 y.o. female here today for follow/up visit for Hypertension. Patient has past medical history of Hypertension, Asthma, Allergic Rhinitis and Tobacco Dependence. Patient admits to smoking .5 packs a day for last 20 years (10 year pack history). Patient with complaints today of repeated episodes of coughing with minimal sputum production (white), wheezing, and night time cough requiring frequent use of albuterol inhaler. Patient states these occurrences happen 2 to 3 times a day and several times at night. Patient also states she feels albuterol is no longer working. Patient presents with elevated BP at today's visit. She states that BP is elevated because of her agitation related to wait to see MD and has taken her lisinopril today. Patient grieving for  recent death of her brother, patient denies suicidal ideations and refuses counseling and medication at this time. Patient has No headache, No chest pain, No SOB, No leg swelling, No abdominal pain - No Nausea, No new weakness tingling or numbness.   Allergies  Allergen Reactions  . Codeine Itching   Past Medical History  Diagnosis Date  . Asthma   . Hypertension    Current Outpatient Prescriptions on File Prior to Visit  Medication Sig Dispense Refill  . acetaminophen-codeine (TYLENOL #3) 300-30 MG per tablet Take 1 tablet by mouth every 4 (four) hours as needed. (Patient not taking: Reported on 06/14/2015) 90 tablet 0  . chlorpheniramine-HYDROcodone (TUSSIONEX PENNKINETIC ER) 10-8 MG/5ML LQCR Take 5 mLs by mouth at bedtime as needed for cough. (Patient not taking: Reported on 02/15/2015) 1 Bottle 0  . metroNIDAZOLE (METROGEL VAGINAL) 0.75 % vaginal gel Place 1 Applicatorful vaginally 2  (two) times daily. (Patient not taking: Reported on 06/14/2015) 70 g 0  . metroNIDAZOLE (METROGEL) 1 % gel Apply topically daily. (Patient not taking: Reported on 06/14/2015) 60 g 3   No current facility-administered medications on file prior to visit.   Family History  Problem Relation Age of Onset  . Hypertension Mother   . Cancer Mother   . Hypertension Father   . Diabetes Father   . Heart disease Father   . Stroke Father   . Cancer Father     bladder cancer    Social History   Social History  . Marital Status: Single    Spouse Name: N/A  . Number of Children: N/A  . Years of Education: N/A   Occupational History  . Not on file.   Social History Main Topics  . Smoking status: Current Every Day Smoker -- 0.50 packs/day for 25 years    Types: Cigarettes  . Smokeless tobacco: Never Used  . Alcohol Use: 3.0 - 3.6 oz/week    5-6 Cans of beer per week     Comment: weekends  . Drug Use: Yes    Special: Marijuana     Comment: marijuana occassionally, last used 3 weeks ago  . Sexual Activity: Yes   Other Topics Concern  . Not on file   Social History Narrative    Review of Systems  Constitutional: Negative for fever, chills, weight loss, malaise/fatigue and diaphoresis.  HENT: Negative for congestion, ear discharge, ear pain, hearing loss, nosebleeds, sore throat and tinnitus.  Eyes: Negative for blurred vision, double vision, photophobia, pain, discharge and redness.  Respiratory: Positive for cough, sputum production, shortness of breath and wheezing. Negative for hemoptysis and stridor.   Cardiovascular: Negative for chest pain, palpitations, orthopnea, claudication, leg swelling and PND.  Gastrointestinal: Negative for heartburn, nausea, vomiting, abdominal pain, diarrhea and constipation.  Genitourinary: Negative for dysuria, urgency, frequency, hematuria and flank pain.  Musculoskeletal: Negative for myalgias, back pain, joint pain, falls and neck pain.  Skin:  Negative for itching and rash.  Neurological: Negative for dizziness, tingling, tremors, sensory change, speech change, focal weakness, seizures, loss of consciousness, weakness and headaches.  Endo/Heme/Allergies: Negative for environmental allergies and polydipsia. Does not bruise/bleed easily.  Psychiatric/Behavioral: Negative for suicidal ideas, hallucinations, memory loss and substance abuse. The patient is not nervous/anxious and does not have insomnia.        Grief    Objective:   Filed Vitals:   06/14/15 1110  BP: 144/100  Pulse:   Temp:   Resp:     Physical Exam  Constitutional: She is oriented to person, place, and time. She appears well-developed and well-nourished.  HENT:  Head: Normocephalic and atraumatic.  Right Ear: External ear normal.  Left Ear: External ear normal.  Nose: Nose normal.  Mouth/Throat: Oropharynx is clear and moist.  Eyes: EOM are normal. Pupils are equal, round, and reactive to light.  Neck: Normal range of motion.  Cardiovascular: Normal rate, regular rhythm, normal heart sounds and intact distal pulses.  Exam reveals no gallop and no friction rub.   No murmur heard. Pulmonary/Chest: She has wheezes. She has no rales. She exhibits no tenderness.  Abdominal: Soft. Bowel sounds are normal.  Neurological: She is alert and oriented to person, place, and time. She has normal reflexes.  Skin: Skin is warm and dry.  Psychiatric: Her behavior is normal. Judgment and thought content normal.  Patient positive for grief reaction  Nursing note and vitals reviewed.   Lab Results  Component Value Date   WBC 9.3 02/15/2015   HGB 14.9 02/15/2015   HCT 43.7 02/15/2015   MCV 97.8 02/15/2015   PLT 227 02/15/2015   Lab Results  Component Value Date   CREATININE 0.47 02/15/2015   BUN 15 02/15/2015   NA 133* 02/15/2015   K 3.8 02/15/2015   CL 97* 02/15/2015   CO2 26 02/15/2015    Lab Results  Component Value Date   HGBA1C 5.40 03/08/2015    Lipid Panel     Component Value Date/Time   CHOL 192 09/07/2014 1231   TRIG 166* 09/07/2014 1231   HDL 42 09/07/2014 1231   CHOLHDL 4.6 09/07/2014 1231   VLDL 33 09/07/2014 1231   LDLCALC 117* 09/07/2014 1231       Assessment and plan:   [name] was seen today for follow-up.  Diagnoses and all orders for this visit:  Essential hypertension -     lisinopril (PRINIVIL,ZESTRIL) 20 MG tablet; Take 1 tablet (20 mg total) by mouth daily. -     Vitamin D, Ergocalciferol, (DRISDOL) 50000 UNITS CAPS capsule; Take 1 capsule (50,000 Units total) by mouth once a week. -     Patient to return for Nurse BP check in 2 weeks.  Smoking       -    Smoking cessation counseling completed. Patient refuses counseling at this time.  Acute upper respiratory infection -     albuterol (PROVENTIL HFA;VENTOLIN HFA) 108 (90 BASE) MCG/ACT inhaler; Inhale 2 puffs into the lungs  every 4 (four) hours as needed for wheezing or shortness of breath.  COPD exacerbation (HCC) -     budesonide-formoterol (SYMBICORT) 160-4.5 MCG/ACT inhaler; Inhale 2 puffs into the lungs 2 (two) times daily. -     predniSONE (DELTASONE) 20 MG tablet; Take 1 tablet (20 mg total) by mouth daily with breakfast. -     guaiFENesin-dextromethorphan (ROBITUSSIN DM) 100-10 MG/5ML syrup; Take 5 mLs by mouth every 4 (four) hours as needed for cough.  Follow up in 2 weeks for Nurse BP check   The patient was given clear instructions to go to ER or return to medical center if symptoms don't improve, worsen or new problems develop. The patient verbalized understanding.   Stephanie Coup, RN, BSN, AGNP - [facility] [place] [phone] 06/14/2015, 11:55 AM

## 2015-10-01 NOTE — Progress Notes (Signed)
I did not provide this service but am closing the chart as it has remained an open encounter.    Stacey Karl, PharmD, BCPS, CPP Clinical Pharmacist Practitioner  [facility] [phone]  

## 2016-02-17 MED FILL — VENTOLIN HFA 90 MCG INHALER: 108 (90 BAS | 25 days supply | Qty: 18 | Fill #1

## 2016-02-17 MED FILL — LISINOPRIL 20 MG TABLET: 20 | 30 days supply | Qty: 30 | Fill #2

## 2016-02-17 MED FILL — SYMBICORT 160-4.5 MCG INH: 160-4.5 | 30 days supply | Qty: 10 | Fill #1

## 2016-02-17 MED FILL — VIT D2 1.25 MG (50,000 UNIT: 1.25 MG | 84 days supply | Qty: 12 | Fill #1

## 2016-06-19 ENCOUNTER — Emergency Department (HOSPITAL_COMMUNITY): Payer: Self-pay

## 2016-06-19 ENCOUNTER — Encounter (HOSPITAL_COMMUNITY): Payer: Self-pay | Admitting: *Deleted

## 2016-06-19 ENCOUNTER — Emergency Department (HOSPITAL_COMMUNITY)
Admission: EM | Admit: 2016-06-19 | Discharge: 2016-06-20 | Disposition: A | Discharge status: Home / Self Care | Payer: Self-pay | Attending: Emergency Medicine | Admitting: Emergency Medicine

## 2016-06-19 DIAGNOSIS — F17210 Nicotine dependence, cigarettes, uncomplicated: Secondary | ICD-10-CM | POA: Insufficient documentation

## 2016-06-19 DIAGNOSIS — I10 Essential (primary) hypertension: Secondary | ICD-10-CM | POA: Insufficient documentation

## 2016-06-19 DIAGNOSIS — J449 Chronic obstructive pulmonary disease, unspecified: Secondary | ICD-10-CM | POA: Insufficient documentation

## 2016-06-19 DIAGNOSIS — K5732 Diverticulitis of large intestine without perforation or abscess without bleeding: Secondary | ICD-10-CM | POA: Insufficient documentation

## 2016-06-19 LAB — BASIC METABOLIC PANEL
Anion gap: 12 (ref 5–15)
BUN: 10 mg/dL (ref 6–20)
CALCIUM: 9.6 mg/dL (ref 8.9–10.3)
CO2: 23 mmol/L (ref 22–32)
CREATININE: 0.58 mg/dL (ref 0.44–1.00)
Chloride: 100 mmol/L — ABNORMAL LOW (ref 101–111)
GFR calc non Af Amer: >60 mL/min (ref >60)
Glucose, Bld: 125 mg/dL — ABNORMAL HIGH (ref 65–99)
Potassium: 3.5 mmol/L (ref 3.5–5.1)
SODIUM: 135 mmol/L (ref 135–145)

## 2016-06-19 LAB — CBC WITH DIFFERENTIAL/PLATELET
Basophils Absolute: 0 10*3/uL (ref 0.0–0.1)
Basophils Relative: 0 %
EOS ABS: 0.2 10*3/uL (ref 0.0–0.7)
Eosinophils Relative: 1 %
HCT: 48.1 % — ABNORMAL HIGH (ref 36.0–46.0)
HEMOGLOBIN: 16.1 g/dL — AB (ref 12.0–15.0)
LYMPHS ABS: 2.4 10*3/uL (ref 0.7–4.0)
Lymphocytes Relative: 17 %
MCH: 32.5 pg (ref 26.0–34.0)
MCHC: 33.5 g/dL (ref 30.0–36.0)
MCV: 97.2 fL (ref 78.0–100.0)
MONO ABS: 0.9 10*3/uL (ref 0.1–1.0)
MONOS PCT: 7 %
NEUTROS PCT: 75 %
Neutro Abs: 10.7 10*3/uL — ABNORMAL HIGH (ref 1.7–7.7)
Platelets: 237 10*3/uL (ref 150–400)
RBC: 4.95 MIL/uL (ref 3.87–5.11)
RDW: 13.6 % (ref 11.5–15.5)
WBC: 14.2 10*3/uL — ABNORMAL HIGH (ref 4.0–10.5)

## 2016-06-19 LAB — URINALYSIS, ROUTINE W REFLEX MICROSCOPIC
Glucose, UA: NEGATIVE mg/dL
KETONES UR: 15 mg/dL — AB
Leukocytes, UA: NEGATIVE
NITRITE: NEGATIVE
PROTEIN: NEGATIVE mg/dL
Specific Gravity, Urine: >1.03 — ABNORMAL HIGH (ref 1.005–1.030)
pH: 5.5 (ref 5.0–8.0)

## 2016-06-19 LAB — URINE MICROSCOPIC-ADD ON

## 2016-06-19 LAB — POC URINE PREG, ED: PREG TEST UR: NEGATIVE

## 2016-06-19 MED ORDER — CIPROFLOXACIN HCL 500 MG PO TABS
500.0000 mg | ORAL_TABLET | Freq: Once | ORAL | Status: AC
  Administered 2016-06-19: 500 mg via ORAL
  Filled 2016-06-19: qty 1

## 2016-06-19 MED ORDER — IOPAMIDOL (ISOVUE-300) INJECTION 61%
INTRAVENOUS | Status: AC
  Administered 2016-06-19: 75 mL via INTRAVENOUS
  Filled 2016-06-19: qty 75

## 2016-06-19 MED ORDER — DICYCLOMINE HCL 10 MG PO CAPS
20.0000 mg | ORAL_CAPSULE | Freq: Once | ORAL | Status: AC
  Administered 2016-06-19: 20 mg via ORAL
  Filled 2016-06-19: qty 2

## 2016-06-19 MED ORDER — METRONIDAZOLE 500 MG PO TABS: 500.0000 mg | ORAL_TABLET | Freq: Two times a day (BID) | ORAL | 0 refills | Status: DC

## 2016-06-19 MED ORDER — ONDANSETRON HCL 4 MG/2ML IJ SOLN
4.0000 mg | Freq: Once | INTRAMUSCULAR | Status: AC
  Administered 2016-06-19: 4 mg via INTRAVENOUS
  Filled 2016-06-19: qty 2

## 2016-06-19 MED ORDER — HYDROMORPHONE HCL 1 MG/ML IJ SOLN
1.0000 mg | Freq: Once | INTRAMUSCULAR | Status: AC
  Administered 2016-06-19: 1 mg via INTRAVENOUS
  Filled 2016-06-19: qty 1

## 2016-06-19 MED ORDER — METRONIDAZOLE 500 MG PO TABS
500.0000 mg | ORAL_TABLET | Freq: Once | ORAL | Status: AC
  Administered 2016-06-19: 500 mg via ORAL
  Filled 2016-06-19: qty 1

## 2016-06-19 MED ORDER — SODIUM CHLORIDE 0.9 % IV BOLUS (SEPSIS)
1000.0000 mL | Freq: Once | INTRAVENOUS | Status: AC
  Administered 2016-06-19: 1000 mL via INTRAVENOUS

## 2016-06-19 MED ORDER — METRONIDAZOLE IN NACL 5-0.79 MG/ML-% IV SOLN: 500.0000 mg | Freq: Once | INTRAVENOUS | Status: DC

## 2016-06-19 MED ORDER — OXYCODONE-ACETAMINOPHEN 5-325 MG PO TABS: 1.0000 | ORAL_TABLET | ORAL | 0 refills | Status: DC | PRN

## 2016-06-19 MED ORDER — SODIUM CHLORIDE 0.9 % IV BOLUS (SEPSIS): 1000.0000 mL | Freq: Once | INTRAVENOUS | Status: DC

## 2016-06-19 MED ORDER — ONDANSETRON HCL 4 MG PO TABS: 4.0000 mg | ORAL_TABLET | Freq: Three times a day (TID) | ORAL | 0 refills | Status: DC | PRN

## 2016-06-19 MED ORDER — HYDROMORPHONE HCL 1 MG/ML IJ SOLN: 1.0000 mg | Freq: Once | INTRAMUSCULAR | Status: DC

## 2016-06-19 MED ORDER — DICYCLOMINE HCL 20 MG PO TABS: 20.0000 mg | ORAL_TABLET | Freq: Two times a day (BID) | ORAL | 0 refills | Status: DC

## 2016-06-19 MED ORDER — CIPROFLOXACIN HCL 500 MG PO TABS: 500.0000 mg | ORAL_TABLET | Freq: Two times a day (BID) | ORAL | 0 refills | Status: DC

## 2016-06-19 MED ORDER — CIPROFLOXACIN IN D5W 400 MG/200ML IV SOLN: 400.0000 mg | Freq: Two times a day (BID) | INTRAVENOUS | Status: DC

## 2016-06-19 NOTE — ED Provider Notes (Signed)
[facility]-EMERGENCY DEPT Provider Note   CSN: [id number] Arrival date & time: 06/19/16  0403     History   Chief Complaint Chief Complaint  Patient presents with  . Abdominal Pain    HPI [name] is a 45 y.o. female.  HPI   Pt has PMH of asthma and hypertension comes to the ER with complaints of abdominal pain. It started 2.5 days ago with a parumbilical cramping pain, it has progressive worsened and then 3 am this morning she woke up with severe pain. She has been having associated mucous diarrhea. She is lactose intolerant and drank milk and a Gas-X and had some diarrhea that was normal and then it returned to mucous again. She is having associated bloating. NO fevers, n/v, no back pain, vaginal bleeding or vaginal discharge. Denies dysuria.  She is currently distressed due to pain.   Past Medical History:  Diagnosis Date  . Asthma   . Hypertension     Patient Active Problem List   Diagnosis Date Noted  . Essential hypertension 06/14/2015  . Smoking 06/14/2015  . COPD exacerbation (HCC) 06/14/2015  . Elevated blood pressure 03/08/2015  . Preventative health care 03/08/2015  . Vaginal odor 03/08/2015  . Fracture of rib of left side 03/08/2015  . Acute upper respiratory infection 09/24/2014  . High triglycerides 02/19/2014  . Low back pain 02/19/2014  . ASCUS favoring benign / HPV Neg 11/26/2013  . Essential hypertension, benign 08/04/2013  . Headache(784.0) 08/04/2013  . Osteomyelitis of left leg (HCC) 09/26/2011  . Asthma 09/26/2011  . Tibial plateau fracture 09/26/2011  . TOBACCO DEPENDENCE 11/08/2006  . RHINITIS, ALLERGIC 11/08/2006  . SCOLIOSIS 11/08/2006    Past Surgical History:  Procedure Laterality Date  . EXTERNAL FIXATION LEG  09/26/2011   Procedure: EXTERNAL FIXATION LEG;  Surgeon: Budd PalmerMichael H Handy, MD;  [facility];  Service: Orthopedics;  Laterality: Left;   Removal of Hardware Left Tibia with application wound vac  . FOOT FRACTURE SURGERY       pins due to "too much movement"  . FRACTURE SURGERY     8/12,9/12    OB History    Gravida Para Term Preterm AB Living   2 1 1  0 1 1   SAB TAB Ectopic Multiple Live Births   0 1 0 0         Home Medications    Prior to Admission medications   Medication Sig Start Date End Date Taking? Authorizing Provider  acetaminophen-codeine (TYLENOL #3) 300-30 MG per tablet Take 1 tablet by mouth every 4 (four) hours as needed. Patient not taking: Reported on 06/14/2015 03/08/15   Quentin Angstlugbemiga E Jegede, MD  albuterol (PROVENTIL HFA;VENTOLIN HFA) 108 (90 BASE) MCG/ACT inhaler Inhale 2 puffs into the lungs every 4 (four) hours as needed for wheezing or shortness of breath. 06/14/15   Quentin Angstlugbemiga E Jegede, MD  budesonide-formoterol (SYMBICORT) 160-4.5 MCG/ACT inhaler Inhale 2 puffs into the lungs 2 (two) times daily. 06/14/15   Quentin Angstlugbemiga E Jegede, MD  chlorpheniramine-HYDROcodone (TUSSIONEX PENNKINETIC ER) 10-8 MG/5ML LQCR Take 5 mLs by mouth at bedtime as needed for cough. Patient not taking: Reported on 02/15/2015 09/24/14   Quentin Angstlugbemiga E Jegede, MD  guaiFENesin-dextromethorphan (ROBITUSSIN DM) 100-10 MG/5ML syrup Take 5 mLs by mouth every 4 (four) hours as needed for cough. 06/14/15   Quentin Angstlugbemiga E Jegede, MD  lisinopril (PRINIVIL,ZESTRIL) 20 MG tablet Take 1 tablet (20 mg total) by mouth daily. 06/14/15   Quentin Angstlugbemiga E Jegede, MD  metroNIDAZOLE (  METROGEL VAGINAL) 0.75 % vaginal gel Place 1 Applicatorful vaginally 2 (two) times daily. Patient not taking: Reported on 06/14/2015 03/08/15   Quentin Angst, MD  metroNIDAZOLE (METROGEL) 1 % gel Apply topically daily. Patient not taking: Reported on 06/14/2015 03/08/15   Quentin Angst, MD  predniSONE (DELTASONE) 20 MG tablet Take 1 tablet (20 mg total) by mouth daily with breakfast. 06/14/15   Quentin Angst, MD  Vitamin D, Ergocalciferol, (DRISDOL) 50000 UNITS CAPS capsule Take 1 capsule (50,000 Units total) by mouth once a week. 06/14/15   Quentin Angst, MD    Family History Family History  Problem Relation Age of Onset  . Hypertension Mother   . Cancer Mother   . Hypertension Father   . Diabetes Father   . Heart disease Father   . Stroke Father   . Cancer Father     bladder cancer     Social History Social History  Substance Use Topics  . Smoking status: Current Every Day Smoker    Packs/day: 0.50    Years: 25.00    Types: Cigarettes  . Smokeless tobacco: Never Used  . Alcohol use 3.0 - 3.6 oz/week    5 - 6 Cans of beer per week     Comment: weekends     Allergies   Codeine   Review of Systems Review of Systems Review of Systems All other systems negative except as documented in the HPI. All pertinent positives and negatives as reviewed in the HPI.   Physical Exam Updated Vital Signs BP 153/81   Pulse 98   Temp 98.9 F (37.2 C) (Oral)   Resp 16   Ht 5\' 4"  (1.626 m)   Wt 77.1 kg   LMP 04/02/2016   SpO2 98%   BMI 29.18 kg/m   Physical Exam  Constitutional: She appears well-developed and well-nourished.  HENT:  Head: Normocephalic and atraumatic.  Eyes: Conjunctivae are normal. Pupils are equal, round, and reactive to light.  Neck: Trachea normal, normal range of motion and full passive range of motion without pain. Neck supple.  Cardiovascular: Normal rate, regular rhythm and normal pulses.   Pulmonary/Chest: Effort normal and breath sounds normal. Chest wall is not dull to percussion. She exhibits no tenderness, no crepitus, no edema, no deformity and no retraction.  Abdominal: Soft. Normal appearance and bowel sounds are normal. She exhibits distension. There is no hepatosplenomegaly. There is tenderness in the periumbilical area and suprapubic area. There is no rigidity, no rebound, no guarding, no CVA tenderness and negative Murphy's sign.  Musculoskeletal: Normal range of motion.  Neurological: She is alert. She has normal strength.  Skin: Skin is warm, dry and intact.  Psychiatric: She has  a normal mood and affect. Her speech is normal and behavior is normal. Judgment and thought content normal. Cognition and memory are normal.     ED Treatments / Results  Labs (all labs ordered are listed, but only abnormal results are displayed) Labs Reviewed  CBC WITH DIFFERENTIAL/PLATELET  BASIC METABOLIC PANEL  URINALYSIS, ROUTINE W REFLEX MICROSCOPIC (NOT AT [facility])  POC URINE PREG, ED    EKG  EKG Interpretation None       Radiology No results found.  Procedures Procedures (including critical care time)  Medications Ordered in ED Medications  sodium chloride 0.9 % bolus 1,000 mL (1,000 mLs Intravenous New Bag/Given 06/19/16 0446)  HYDROmorphone (DILAUDID) injection 1 mg (1 mg Intravenous Given 06/19/16 0447)  ondansetron (ZOFRAN) injection 4 mg (4  mg Intravenous Given 06/19/16 0446)     Initial Impression / Assessment and Plan / ED Course  I have reviewed the triage vital signs and the nursing notes.  Pertinent labs & imaging results that were available during my care of the patient were reviewed by me and considered in my medical decision making (see chart for details).  Clinical Course    At end of shift patient sign out to Huntley Dec, PA-C Waiting for CT abd/pelv w contrast and labs.  Final Clinical Impressions(s) / ED Diagnoses   Final diagnoses:  None    New Prescriptions New Prescriptions   No medications on file     Marlon Pel, PA-C 06/19/16 0457    Shon Baton, MD 06/19/16 [phone]

## 2016-06-19 NOTE — ED Provider Notes (Signed)
Subjective  Assumed care of patient in shift handoff from PA Green Patient here with several days of diarrhea LLQ>RLQ + mucous in diarrhea. Hx of lactose intolerance. Self treated by drinking milk and taking gas x. Acutely worse at 3:00AM . + leukocytosis.  Awaiting CT scan.  Objective  Physical Exam  Nursing note and vitals reviewed. Constitutional: She is oriented to person, place, and time. She appears well-developed and well-nourished. No distress.  HENT:  Head: Normocephalic and atraumatic.  Eyes: Conjunctivae normal and EOM are normal. Pupils are equal, round, and reactive to light. No scleral icterus.  Neck: Normal range of motion.  Cardiovascular: Normal rate, regular rhythm and normal heart sounds.  Exam reveals no gallop and no friction rub.   No murmur heard. Pulmonary/Chest: Effort normal and breath sounds normal. No respiratory distress.  Abdominal: Soft. Bowel sounds are normal. + distension, tender in the periumbilical and BL lower quadrants. Neurological: She is alert and oriented to person, place, and time.  Skin: Skin is warm and dry. She is not diaphoretic.     A/p - patient is tolerating PO. No vomiting. Her CT scan does show extensive sigmoid diverticulitis with reactive pelvic fluid. There is also a clip that is disloged from a previous tubal ligation. I have discussed this with the patient and she acknowledges the risk of pregnancy. She is advised to use protection. She is currently perimenopausal. I have discussed reasons to seek immediate medical care in the ED. She I ambulatory and appears safe for discharge at this time.     Arthor CaptainAbigail Lacreshia Bondarenko, PA-C 06/19/16 [phone]    Shon Batonourtney F Horton, MD 06/19/16 2255

## 2016-06-19 NOTE — ED Triage Notes (Signed)
Pt c/o mid lower abd pain x 2 days. Took gas-x and drank milk(which usually causes diarrhea) without improvement. Hx of endometriosis, pain feels different.

## 2016-06-19 NOTE — Discharge Instructions (Signed)
Follow a clear liquid diet for the next 3-4 days. Then you may slowly progress to liquids and then easy to digest foods.  SEEK IMMEDIATE MEDICAL CARE IF:  Your pain becomes worse. Your symptoms do not get better. Your symptoms suddenly get worse. You have a fever. You have repeated vomiting. You have bloody or black, tarry stools.

## 2017-05-29 ENCOUNTER — Encounter (HOSPITAL_COMMUNITY): Payer: Self-pay | Admitting: *Deleted

## 2017-05-29 ENCOUNTER — Ambulatory Visit (HOSPITAL_COMMUNITY)
Admission: EM | Admit: 2017-05-29 | Discharge: 2017-05-29 | Disposition: A | Discharge status: Home / Self Care | Payer: Self-pay | Attending: Family Medicine | Admitting: Family Medicine

## 2017-05-29 DIAGNOSIS — R05 Cough: Secondary | ICD-10-CM

## 2017-05-29 DIAGNOSIS — J069 Acute upper respiratory infection, unspecified: Secondary | ICD-10-CM

## 2017-05-29 MED ORDER — ALBUTEROL SULFATE HFA 108 (90 BASE) MCG/ACT IN AERS: 2.0000 | INHALATION_SPRAY | RESPIRATORY_TRACT | 0 refills | Status: DC | PRN

## 2017-05-29 MED ORDER — HYDROCODONE-HOMATROPINE 5-1.5 MG/5ML PO SYRP: 5.0000 mL | ORAL_SOLUTION | Freq: Four times a day (QID) | ORAL | 0 refills | Status: DC | PRN

## 2017-05-29 NOTE — ED Provider Notes (Signed)
[facility]    CSN: [id number] Arrival date & time: 05/29/17  1116  History   Chief Complaint Chief Complaint  Patient presents with  . Nasal Congestion  . Cough    HPI [name] is a 46 y.o. female.   HPI  Patient presenting with cough and nasal congestion. Symptoms started two weeks ago. Initially had fevers for about two days (Tmax 102F), then develops nasal congestion with sinus pressure and headaches. After a couple days, started coughing as well. Cough has been productive of clear sputum only. Has been coughing so much that she cannot sleep at night. Says cough is now most bothersome symptom. Now has sore throat which she attributes to coughing so much. Has been using Cepachol throat lozenges which have helped a lot. Endorses wheezing and some difficulty breathing when coughing a lot. Does have history of asthma and is an every day smoker. Has tried Mucinex, Dayquil, Nyquil, and Theraflu, which have not helped. Has been using a friend's albuterol inhaler which has helped some. Has been taking ibuprofen and Tylenol for headache and fevers which have helped. Has history of seasonal allergies for which she takes Allegra in the spring but has not tried taking that. Has been eating and drinking normally. Says symptoms have not improved at all since onset.    Past Medical History:  Diagnosis Date  . Asthma   . Hypertension     Patient Active Problem List   Diagnosis Date Noted  . Essential hypertension 06/14/2015  . Smoking 06/14/2015  . COPD exacerbation (HCC) 06/14/2015  . Elevated blood pressure 03/08/2015  . Preventative health care 03/08/2015  . Vaginal odor 03/08/2015  . Fracture of rib of left side 03/08/2015  . Acute upper respiratory infection 09/24/2014  . High triglycerides 02/19/2014  . Low back pain 02/19/2014  . ASCUS favoring benign / HPV Neg 11/26/2013  . Essential hypertension, benign 08/04/2013  . Headache(784.0) 08/04/2013  .  Osteomyelitis of left leg (HCC) 09/26/2011  . Asthma 09/26/2011  . Tibial plateau fracture 09/26/2011  . TOBACCO DEPENDENCE 11/08/2006  . RHINITIS, ALLERGIC 11/08/2006  . SCOLIOSIS 11/08/2006    Past Surgical History:  Procedure Laterality Date  . EXTERNAL FIXATION LEG  09/26/2011   Procedure: EXTERNAL FIXATION LEG;  Surgeon: Budd Palmer, MD;  Location: [facility] OR;  Service: Orthopedics;  Laterality: Left;   Removal of Hardware Left Tibia with application wound vac  . FOOT FRACTURE SURGERY     pins due to "too much movement"  . FRACTURE SURGERY     8/12,9/12    OB History    Gravida Para Term Preterm AB Living   0 1 1   SAB TAB Ectopic Multiple Live Births   0 1 0 0         Home Medications    Prior to Admission medications   Medication Sig Start Date End Date Taking? Authorizing Provider  albuterol (PROVENTIL HFA;VENTOLIN HFA) 108 (90 Base) MCG/ACT inhaler Inhale 2 puffs into the lungs every 4 (four) hours as needed for wheezing or shortness of breath. 05/29/17   Marquette Saa, MD  budesonide-formoterol [facility]) 160-4.5 MCG/ACT inhaler Inhale 2 puffs into the lungs 2 (two) times daily. 06/14/15   Quentin Angst, MD  chlorpheniramine-HYDROcodone (TUSSIONEX PENNKINETIC ER) 10-8 MG/5ML LQCR Take 5 mLs by mouth at bedtime as needed for cough. Patient not taking: Reported on 06/19/2016 09/24/14   Quentin Angst, MD  ciprofloxacin (CIPRO) 500 MG tablet  Take 1 tablet (500 mg total) by mouth every 12 (twelve) hours. 06/19/16   Harris, Cammy Copa, PA-C  dicyclomine (BENTYL) 20 MG tablet Take 1 tablet (20 mg total) by mouth 2 (two) times daily. 06/19/16   Harris, Marabelle Cushman, PA-C  guaiFENesin-dextromethorphan (ROBITUSSIN DM) 100-10 MG/5ML syrup Take 5 mLs by mouth every 4 (four) hours as needed for cough. Patient not taking: Reported on 06/19/2016 06/14/15   Quentin Angst, MD  HYDROcodone-homatropine [facility]) 5-1.5 MG/5ML syrup Take 5 mLs by mouth every 6 (six)  hours as needed for cough. 05/29/17   Marquette Saa, MD  lisinopril (PRINIVIL,ZESTRIL) 20 MG tablet Take 1 tablet (20 mg total) by mouth daily. 06/14/15   Quentin Angst, MD  metroNIDAZOLE (FLAGYL) 500 MG tablet Take 1 tablet (500 mg total) by mouth 2 (two) times daily. One po bid x 7 days 06/19/16   Arthor Captain, PA-C  metroNIDAZOLE (METROGEL VAGINAL) 0.75 % vaginal gel Place 1 Applicatorful vaginally 2 (two) times daily. Patient not taking: Reported on 06/19/2016 03/08/15   Quentin Angst, MD  metroNIDAZOLE (METROGEL) 1 % gel Apply topically daily. Patient not taking: Reported on 06/19/2016 03/08/15   Quentin Angst, MD  ondansetron (ZOFRAN) 4 MG tablet Take 1 tablet (4 mg total) by mouth every 8 (eight) hours as needed for nausea or vomiting. 06/19/16   Harris, Cammy Copa, PA-C  oxyCODONE-acetaminophen (PERCOCET) 5-325 MG tablet Take 1-2 tablets by mouth every 4 (four) hours as needed. 06/19/16   Arthor Captain, PA-C  predniSONE (DELTASONE) 20 MG tablet Take 1 tablet (20 mg total) by mouth daily with breakfast. Patient not taking: Reported on 06/19/2016 06/14/15   Quentin Angst, MD  Vitamin D, Ergocalciferol, (DRISDOL) 50000 UNITS CAPS capsule Take 1 capsule (50,000 Units total) by mouth once a week. 06/14/15   Quentin Angst, MD    Family History Family History  Problem Relation Age of Onset  . Hypertension Mother   . Cancer Mother   . Hypertension Father   . Diabetes Father   . Heart disease Father   . Stroke Father   . Cancer Father        bladder cancer     Social History Social History  Substance Use Topics  . Smoking status: Current Every Day Smoker    Packs/day: 0.50    Years: 25.00    Types: Cigarettes  . Smokeless tobacco: Never Used  . Alcohol use 3.0 - 3.6 oz/week    5 - 6 Cans of beer per week     Comment: weekends     Allergies   Codeine   Review of Systems Review of Systems  Constitutional: Positive for activity change  and fever. Negative for appetite change and chills.  HENT: Positive for congestion, postnasal drip, rhinorrhea, sinus pain, sinus pressure and sore throat.   Eyes: Negative for discharge and itching.  Respiratory: Positive for cough and wheezing. Negative for shortness of breath.   Neurological: Positive for headaches.    Physical Exam Triage Vital Signs ED Triage Vitals [05/29/17 1137]  Enc Vitals Group     BP      Pulse Rate 84     Resp 18     Temp 98.5 F (36.9 C)     Temp Source Oral     SpO2 96 %     Weight      Height      Head Circumference      Peak Flow      Pain  Score      Pain Loc      Pain Edu?      Excl. in GC?    No data found.   Updated Vital Signs Pulse 84   Temp 98.5 F (36.9 C) (Oral)   Resp 18   SpO2 96%   Physical Exam  Constitutional: She is oriented to person, place, and time. She appears well-developed and well-nourished. No distress.  HENT:  Head: Normocephalic and atraumatic.  Nose: Nose normal.  Mild oropharyngeal erythema. No oropharyngeal exudates. MMM.   Eyes: Pupils are equal, round, and reactive to light. Conjunctivae and EOM are normal. Right eye exhibits no discharge. Left eye exhibits no discharge.  Neck: Normal range of motion. Neck supple.  Cardiovascular: Normal rate, regular rhythm and normal heart sounds.   No murmur heard. Pulmonary/Chest: Effort normal. No respiratory distress. She has wheezes.  Abdominal: Soft. Bowel sounds are normal. She exhibits no distension. There is no tenderness.  Lymphadenopathy:    She has cervical adenopathy.  Neurological: She is alert and oriented to person, place, and time.  Skin: Skin is warm and dry.  Psychiatric: She has a normal mood and affect. Her behavior is normal.    UC Treatments / Results  Labs (all labs ordered are listed, but only abnormal results are displayed) Labs Reviewed - No data to display  EKG  EKG Interpretation None       Radiology No results  found.  Procedures Procedures (including critical care time)  Medications Ordered in UC Medications - No data to display   Initial Impression / Assessment and Plan / UC Course  I have reviewed the triage vital signs and the nursing notes.  Pertinent labs & imaging results that were available during my care of the patient were reviewed by me and considered in my medical decision making (see chart for details).     Patient presenting with cough and nasal congestion consistent with viral URI. No improvement since onset of symptoms including no double sickening, so less likely bacterial etiology. Now with cough as most bothersome symptom. Wheezing noted on exam, though patient with history of asthma and current every day tobacco use. Normal WOB on RA however and able to speak in full sentences without difficulty. Good air movement on auscultation as well. Mild oropharyngeal erythema but no exudates. Will treat conservatively with albuterol inhaler and Hycodan cough syrup. Encouraged patient to continue ibuprofen/Tylenol for headaches, sore throat, and fevers, as well as Cepachol throat lozenges for sore throat.   Final Clinical Impressions(s) / UC Diagnoses   Final diagnoses:  Viral upper respiratory tract infection    New Prescriptions New Prescriptions   HYDROCODONE-HOMATROPINE (HYCODAN) 5-1.5 MG/5ML SYRUP    Take 5 mLs by mouth every 6 (six) hours as needed for cough.    Tarri Abernethy, MD, MPH PGY-3     Marquette Saa, MD 05/29/17 1201

## 2017-05-29 NOTE — Discharge Instructions (Signed)
It was nice meeting you today Ms. Richoux!  Please use the albuterol inhaler I have prescribed up to every 4 hours as needed for wheezing and cough. You can also use the prescribed cough syrup for cough up to every 6 hours as needed. Be aware that this will make you sleepy.   For headache and sinus pressure, you can continue to take ibuprofen or Tylenol. For sore throat, you can continue to use the Cepachol lozenges. For nasal congestion, you can continue to use Mucinex or Delsym.   If you are not getting better by the end of the week, please call your regular doctor to schedule an appointment or return to our clinic.   If you have any questions or concerns, please feel free to call the clinic.   Be well,  Dr. Natale Milch

## 2017-05-29 NOTE — ED Triage Notes (Signed)
Patient reports cough and nasal congestion since the 5th. Has been using OTC meds to help with symptoms. States she had a fever when symptoms first started and has had continuous low grade fever at night.

## 2019-06-26 ENCOUNTER — Ambulatory Visit (INDEPENDENT_AMBULATORY_CARE_PROVIDER_SITE_OTHER): Payer: Self-pay | Admitting: Internal Medicine

## 2019-06-26 ENCOUNTER — Encounter: Payer: Self-pay | Admitting: Internal Medicine

## 2019-06-26 ENCOUNTER — Other Ambulatory Visit: Payer: Self-pay

## 2019-06-26 DIAGNOSIS — J4540 Moderate persistent asthma, uncomplicated: Secondary | ICD-10-CM

## 2019-06-26 DIAGNOSIS — I10 Essential (primary) hypertension: Secondary | ICD-10-CM

## 2019-06-26 MED ORDER — RAMIPRIL 10 MG PO CAPS: 10.0000 mg | ORAL_CAPSULE | Freq: Two times a day (BID) | ORAL | 2 refills | Status: DC

## 2019-06-26 NOTE — Assessment & Plan Note (Signed)
Encouraged smoking cessation- she declines Continue Albuterol prn

## 2019-06-26 NOTE — Patient Instructions (Signed)

## 2019-06-26 NOTE — Assessment & Plan Note (Signed)
Increase Ramipril 10 mg BID Reinforced DASH diet and exercise for weight loss  RTC in 1 month, for BP check

## 2019-06-26 NOTE — Progress Notes (Signed)
HPI  Pt presents to the clinic today to establish care and for management of the conditions listed below. She has not had a PCP in many years.  Asthma: Moderate persistent. Managed on Albuterol prn. There are no PFT's on file. She does smoke.  HTN: her BP today is 140/98.  She is taking Ramipril as prescribed. She stopped Amlodipine secondary to headaches. ECG from 02/2015 reviewed.  Flu: never Tetanus: > 10 years ago Pap Smear: 08/2014 Mammogram: never Colon Screening: never Vision Screening: as needed Dentist: as needed   Past Medical History:  Diagnosis Date  . Asthma   . Hypertension     Current Outpatient Medications  Medication Sig Dispense Refill  . albuterol (PROVENTIL HFA;VENTOLIN HFA) 108 (90 Base) MCG/ACT inhaler Inhale 2 puffs into the lungs every 4 (four) hours as needed for wheezing or shortness of breath. 1 Inhaler 0  . budesonide-formoterol (SYMBICORT) 160-4.5 MCG/ACT inhaler Inhale 2 puffs into the lungs 2 (two) times daily. 1 Inhaler 3  . chlorpheniramine-HYDROcodone (TUSSIONEX PENNKINETIC ER) 10-8 MG/5ML LQCR Take 5 mLs by mouth at bedtime as needed for cough. 1 Bottle 0  . ciprofloxacin (CIPRO) 500 MG tablet Take 1 tablet (500 mg total) by mouth every 12 (twelve) hours. 20 tablet 0  . dicyclomine (BENTYL) 20 MG tablet Take 1 tablet (20 mg total) by mouth 2 (two) times daily. 20 tablet 0  . guaiFENesin-dextromethorphan (ROBITUSSIN DM) 100-10 MG/5ML syrup Take 5 mLs by mouth every 4 (four) hours as needed for cough. 480 mL 3  . HYDROcodone-homatropine (HYCODAN) 5-1.5 MG/5ML syrup Take 5 mLs by mouth every 6 (six) hours as needed for cough. 120 mL 0  . lisinopril (PRINIVIL,ZESTRIL) 20 MG tablet Take 1 tablet (20 mg total) by mouth daily. 90 tablet 3  . metroNIDAZOLE (FLAGYL) 500 MG tablet Take 1 tablet (500 mg total) by mouth 2 (two) times daily. One po bid x 7 days 20 tablet 0  . metroNIDAZOLE (METROGEL VAGINAL) 0.75 % vaginal gel Place 1 Applicatorful vaginally 2  (two) times daily. 70 g 0  . metroNIDAZOLE (METROGEL) 1 % gel Apply topically daily. 60 g 3  . ondansetron (ZOFRAN) 4 MG tablet Take 1 tablet (4 mg total) by mouth every 8 (eight) hours as needed for nausea or vomiting. 10 tablet 0  . oxyCODONE-acetaminophen (PERCOCET) 5-325 MG tablet Take 1-2 tablets by mouth every 4 (four) hours as needed. 20 tablet 0  . predniSONE (DELTASONE) 20 MG tablet Take 1 tablet (20 mg total) by mouth daily with breakfast. 5 tablet 0  . Vitamin D, Ergocalciferol, (DRISDOL) 50000 UNITS CAPS capsule Take 1 capsule (50,000 Units total) by mouth once a week. 30 capsule 3   No current facility-administered medications for this visit.     Allergies  Allergen Reactions  . Codeine Itching    Family History  Problem Relation Age of Onset  . Hypertension Mother   . Cancer Mother   . Hypertension Father   . Diabetes Father   . Heart disease Father   . Stroke Father   . Cancer Father        bladder cancer     Social History   Socioeconomic History  . Marital status: Divorced    Spouse name: Not on file  . Number of children: Not on file  . Years of education: Not on file  . Highest education level: Not on file  Occupational History  . Not on file  Social Needs  . Financial resource strain: Not  on file  . Food insecurity    Worry: Not on file    Inability: Not on file  . Transportation needs    Medical: Not on file    Non-medical: Not on file  Tobacco Use  . Smoking status: Current Every Day Smoker    Packs/day: 0.50    Years: 25.00    Pack years: 12.50    Types: Cigarettes  . Smokeless tobacco: Never Used  Substance and Sexual Activity  . Alcohol use: Yes    Alcohol/week: 5.0 - 6.0 standard drinks    Types: 5 - 6 Cans of beer per week    Comment: weekends  . Drug use: Yes    Types: Marijuana    Comment: marijuana occassionally, last used 3 weeks ago  . Sexual activity: Yes  Lifestyle  . Physical activity    Days per week: Not on file     Minutes per session: Not on file  . Stress: Not on file  Relationships  . Social Musician on phone: Not on file    Gets together: Not on file    Attends religious service: Not on file    Active member of club or organization: Not on file    Attends meetings of clubs or organizations: Not on file    Relationship status: Not on file  . Intimate partner violence    Fear of current or ex partner: Not on file    Emotionally abused: Not on file    Physically abused: Not on file    Forced sexual activity: Not on file  Other Topics Concern  . Not on file  Social History Narrative  . Not on file    ROS:  Constitutional: Denies fever, malaise, fatigue, headache or abrupt weight changes.  HEENT: Denies eye pain, eye redness, ear pain, ringing in the ears, wax buildup, runny nose, nasal congestion, bloody nose, or sore throat. Respiratory: Denies difficulty breathing, shortness of breath, cough or sputum production.   Cardiovascular: Denies chest pain, chest tightness, palpitations or swelling in the hands or feet.  Musculoskeletal: Pt reports scoliosis, chronic left leg pain. Denies decrease in range of motion, difficulty with gait, muscle pain or joint pain and swelling.  Skin: Denies redness, rashes, lesions or ulcercations.  Neurological: Denies dizziness, difficulty with memory, difficulty with speech or problems with balance and coordination.  Psych: Denies anxiety, depression, SI/HI.  No other specific complaints in a complete review of systems (except as listed in HPI above).  PE:  BP (!) 146/98   Pulse 87   Temp 97.7 F (36.5 C) (Temporal)   Wt 185 lb (83.9 kg)   LMP 08/12/2017   SpO2 98%   BMI 31.76 kg/m   Wt Readings from Last 3 Encounters:  06/19/16 170 lb (77.1 kg)  06/14/15 170 lb (77.1 kg)  03/08/15 176 lb 3.2 oz (79.9 kg)    General: Appears her stated age, obese, in NAD. Skin: Dry and intact. Cardiovascular: Normal rate and rhythm.   Pulmonary/Chest: Normal effort and positive vesicular breath sounds. No respiratory distress. No wheezes, rales or ronchi noted.  Neurological: Alert and oriented.  Psychiatric: Mood and affect normal. Behavior is normal. Judgment and thought content normal.     BMET    Component Value Date/Time   NA 135 06/19/2016 0445   K 3.5 06/19/2016 0445   CL 100 (L) 06/19/2016 0445   CO2 23 06/19/2016 0445   GLUCOSE 125 (H) 06/19/2016 0445  BUN 10 06/19/2016 0445   CREATININE 0.58 06/19/2016 0445   CREATININE 0.64 09/07/2014 1231   CALCIUM 9.6 06/19/2016 0445   GFRNONAA >60 06/19/2016 0445   GFRNONAA >89 09/07/2014 1231   GFRAA >60 06/19/2016 0445   GFRAA >89 09/07/2014 1231    Lipid Panel     Component Value Date/Time   CHOL 192 09/07/2014 1231   TRIG 166 (H) 09/07/2014 1231   HDL 42 09/07/2014 1231   CHOLHDL 4.6 09/07/2014 1231   VLDL 33 09/07/2014 1231   LDLCALC 117 (H) 09/07/2014 1231    CBC    Component Value Date/Time   WBC 14.2 (H) 06/19/2016 0445   RBC 4.95 06/19/2016 0445   HGB 16.1 (H) 06/19/2016 0445   HCT 48.1 (H) 06/19/2016 0445   PLT 237 06/19/2016 0445   MCV 97.2 06/19/2016 0445   MCH 32.5 06/19/2016 0445   MCHC 33.5 06/19/2016 0445   RDW 13.6 06/19/2016 0445   LYMPHSABS 2.4 06/19/2016 0445   MONOABS 0.9 06/19/2016 0445   EOSABS 0.2 06/19/2016 0445   BASOSABS 0.0 06/19/2016 0445    Hgb A1C Lab Results  Component Value Date   HGBA1C 5.40 03/08/2015     Assessment and Plan:

## 2019-07-22 ENCOUNTER — Telehealth: Payer: Self-pay | Admitting: *Deleted

## 2019-07-22 NOTE — Telephone Encounter (Signed)
Patient called stating the cost of Ramipril the way it was last written is going to cost her $300. Patient stated that she had to go back on the 5 mg and takes two twice a day. Patient stated that her blood pressure readings are still high. Patient stated the other day she got 205/102. Patient stated that she has been out of her blood pressure medication now for two days. Patient stated that she took her blood pressure yesterday after being out of the medication and it was 158/102. Patient stated that she has a friend that takes Coreg and that works really good for his blood pressure. Patient wants to know if she could possibly try Coreg? Patient stated that she can get that real cheap at the pharmacy with a GoodRx card. Pharmacy [facility]

## 2019-07-22 NOTE — Telephone Encounter (Signed)
She is due for follow up of HTN. She needs to schedule an appt to discuss this.

## 2019-07-28 ENCOUNTER — Other Ambulatory Visit: Payer: Self-pay

## 2019-07-28 ENCOUNTER — Ambulatory Visit (INDEPENDENT_AMBULATORY_CARE_PROVIDER_SITE_OTHER): Payer: Self-pay | Admitting: Internal Medicine

## 2019-07-28 ENCOUNTER — Encounter: Payer: Self-pay | Admitting: Internal Medicine

## 2019-07-28 DIAGNOSIS — I10 Essential (primary) hypertension: Secondary | ICD-10-CM

## 2019-07-28 MED ORDER — LOSARTAN POTASSIUM 50 MG PO TABS: 50.0000 mg | ORAL_TABLET | Freq: Every day | ORAL | 2 refills | Status: AC

## 2019-07-28 NOTE — Assessment & Plan Note (Signed)
Uncontrolled, can not afford medication D/cRamipril RX for Losartan 50 mg daily Reinforced DASH diet and exercise for weight loss  RTC in 2 weeks for nurse visit BP check

## 2019-07-28 NOTE — Patient Instructions (Signed)

## 2019-07-28 NOTE — Progress Notes (Signed)
Subjective:    Patient ID: [name], female    DOB: 1970-10-20, 48 y.o.   MRN: [id number]  HPI  Pt presents to the clinic today for follow up HTN. At her last visit, her Ramipril was increased to 10 mg BID. She has been taking the medication as prescribed but reports she ran out 1 week ago. She denies adverse side effects. Her BP today is 148/98. She really feels the Ramipril is not working. She reports Amlodipine caused headaches. ECG from 02/2015 reviewed.  Review of Systems      Past Medical History:  Diagnosis Date  . Asthma   . Hypertension     Current Outpatient Medications  Medication Sig Dispense Refill  . ibuprofen (ADVIL) 100 MG/5ML suspension Take 200 mg by mouth daily as needed.    . ramipril (ALTACE) 10 MG capsule Take 1 capsule (10 mg total) by mouth 2 (two) times daily. 60 capsule 2   No current facility-administered medications for this visit.     Allergies  Allergen Reactions  . Codeine Itching    Family History  Problem Relation Age of Onset  . Hypertension Mother   . Cancer Mother   . Hypertension Father   . Diabetes Father   . Heart disease Father   . Stroke Father   . Bladder Cancer Father     Social History   Socioeconomic History  . Marital status: Divorced    Spouse name: Not on file  . Number of children: Not on file  . Years of education: Not on file  . Highest education level: Not on file  Occupational History  . Not on file  Social Needs  . Financial resource strain: Not on file  . Food insecurity    Worry: Not on file    Inability: Not on file  . Transportation needs    Medical: Not on file    Non-medical: Not on file  Tobacco Use  . Smoking status: Current Every Day Smoker    Packs/day: 0.50    Years: 25.00    Pack years: 12.50    Types: Cigarettes  . Smokeless tobacco: Never Used  Substance and Sexual Activity  . Alcohol use: Yes    Alcohol/week: 5.0 - 6.0 standard drinks    Types: 5 - 6 Cans of beer per week     Comment: weekends  . Drug use: Yes    Types: Marijuana    Comment: marijuana occassionally, last used 3 weeks ago  . Sexual activity: Yes  Lifestyle  . Physical activity    Days per week: Not on file    Minutes per session: Not on file  . Stress: Not on file  Relationships  . Social Herbalist on phone: Not on file    Gets together: Not on file    Attends religious service: Not on file    Active member of club or organization: Not on file    Attends meetings of clubs or organizations: Not on file    Relationship status: Not on file  . Intimate partner violence    Fear of current or ex partner: Not on file    Emotionally abused: Not on file    Physically abused: Not on file    Forced sexual activity: Not on file  Other Topics Concern  . Not on file  Social History Narrative  . Not on file     Constitutional: Denies fever, malaise, fatigue, headache or abrupt  weight changes.  Respiratory: Denies difficulty breathing, shortness of breath, cough or sputum production.   Cardiovascular: Denies chest pain, chest tightness, palpitations or swelling in the hands or feet.  Neurological: Denies dizziness, difficulty with memory, difficulty with speech or problems with balance and coordination.    No other specific complaints in a complete review of systems (except as listed in HPI above).  Objective:   Physical Exam  BP (!) 148/98   Pulse 83   Temp (!) 97.3 F (36.3 C) (Temporal)   Wt 184 lb (83.5 kg)   LMP 08/12/2017   SpO2 96%   BMI 31.58 kg/m  Wt Readings from Last 3 Encounters:  07/28/19 184 lb (83.5 kg)  06/26/19 185 lb (83.9 kg)  06/19/16 170 lb (77.1 kg)    General: Appears her stated age, obese, in NAD. Cardiovascular: Normal rate and rhythm. S1,S2 noted.  No murmur, rubs or gallops noted. N Pulmonary/Chest: Normal effort and positive vesicular breath sounds. No respiratory distress. No wheezes, rales or ronchi noted.  Neurological: Alert and  oriented.    BMET    Component Value Date/Time   NA 135 06/19/2016 0445   K 3.5 06/19/2016 0445   CL 100 (L) 06/19/2016 0445   CO2 23 06/19/2016 0445   GLUCOSE 125 (H) 06/19/2016 0445   BUN 10 06/19/2016 0445   CREATININE 0.58 06/19/2016 0445   CREATININE 0.64 09/07/2014 1231   CALCIUM 9.6 06/19/2016 0445   GFRNONAA >60 06/19/2016 0445   GFRNONAA >89 09/07/2014 1231   GFRAA >60 06/19/2016 0445   GFRAA >89 09/07/2014 1231    Lipid Panel     Component Value Date/Time   CHOL 192 09/07/2014 1231   TRIG 166 (H) 09/07/2014 1231   HDL 42 09/07/2014 1231   CHOLHDL 4.6 09/07/2014 1231   VLDL 33 09/07/2014 1231   LDLCALC 117 (H) 09/07/2014 1231    CBC    Component Value Date/Time   WBC 14.2 (H) 06/19/2016 0445   RBC 4.95 06/19/2016 0445   HGB 16.1 (H) 06/19/2016 0445   HCT 48.1 (H) 06/19/2016 0445   PLT 237 06/19/2016 0445   MCV 97.2 06/19/2016 0445   MCH 32.5 06/19/2016 0445   MCHC 33.5 06/19/2016 0445   RDW 13.6 06/19/2016 0445   LYMPHSABS 2.4 06/19/2016 0445   MONOABS 0.9 06/19/2016 0445   EOSABS 0.2 06/19/2016 0445   BASOSABS 0.0 06/19/2016 0445    Hgb A1C Lab Results  Component Value Date   HGBA1C 5.40 03/08/2015            Assessment & Plan:

## 2019-08-13 ENCOUNTER — Ambulatory Visit: Payer: Self-pay

## 2019-08-13 ENCOUNTER — Other Ambulatory Visit: Payer: Self-pay

## 2019-08-13 VITALS — BP 160/104

## 2019-08-13 DIAGNOSIS — I10 Essential (primary) hypertension: Secondary | ICD-10-CM

## 2019-08-13 NOTE — Progress Notes (Signed)
Can you call and ask her when she took her BP med prior to the appt. This does not make any sense. Her BP is higher on Losartan than it was off medications. Webb Silversmith, NP

## 2019-08-13 NOTE — Progress Notes (Signed)
Pt came for scheduled 2 week BP follow-up. Here it was 160/104.   Pt says her BP at home yesterday was 186/114.   Pt said to call her at [phone] to make any med changes.

## 2019-09-01 ENCOUNTER — Telehealth: Payer: Self-pay

## 2019-09-01 MED ORDER — ALBUTEROL SULFATE HFA 108 (90 BASE) MCG/ACT IN AERS: 2.0000 | INHALATION_SPRAY | Freq: Four times a day (QID) | RESPIRATORY_TRACT | 1 refills | Status: DC | PRN

## 2019-09-01 NOTE — Telephone Encounter (Signed)
Albuterol sent to [facility]

## 2019-09-01 NOTE — Telephone Encounter (Signed)
Pt had left v/m requesting refill inhaler; last albuterol inhaler sent .05/29/2017 hx med list. I spoke with pt; pt has been getting inhaler from friend who can get them cheap. Pt does not talk to that friend anymore. Pt has prod cough with clear phlegm for all the time but cough has worsened; drainage back of the throat; pt has wheezing in back of her throat. No fever and no SOB or CP. Pt's chest feels tight from coughing so much. Pt has asthma. Pt does not want to go to UC or ED for eval and treat; pt was going to schedule a virtual visit with R Baity NP and then pt said no she just wanted a refill on albuterol inhaler and if she cannot get that she will wait until she is worse and then go to ED. I advised I would send note to Avie Echevaria NP and pt request cb.[facility] [place].

## 2019-09-01 NOTE — Addendum Note (Signed)
Addended by: Jearld Fenton on: 09/01/2019 01:58 PM   Modules accepted: Orders

## 2019-09-02 NOTE — Telephone Encounter (Signed)
Pt is aware Rx sent to [facility]

## 2019-11-05 ENCOUNTER — Telehealth: Payer: Self-pay

## 2019-12-01 NOTE — Telephone Encounter (Signed)
Error

## 2022-08-10 ENCOUNTER — Other Ambulatory Visit (HOSPITAL_COMMUNITY): Payer: Self-pay

## 2024-03-12 ENCOUNTER — Ambulatory Visit (HOSPITAL_COMMUNITY)
Admission: EM | Admit: 2024-03-12 | Discharge: 2024-03-12 | Disposition: A | Discharge status: Home / Self Care | Payer: Self-pay

## 2024-03-12 ENCOUNTER — Ambulatory Visit (INDEPENDENT_AMBULATORY_CARE_PROVIDER_SITE_OTHER): Payer: Self-pay

## 2024-03-12 ENCOUNTER — Encounter (HOSPITAL_COMMUNITY): Payer: Self-pay

## 2024-03-12 DIAGNOSIS — I10 Essential (primary) hypertension: Secondary | ICD-10-CM

## 2024-03-12 DIAGNOSIS — J209 Acute bronchitis, unspecified: Secondary | ICD-10-CM

## 2024-03-12 DIAGNOSIS — J4540 Moderate persistent asthma, uncomplicated: Secondary | ICD-10-CM

## 2024-03-12 MED ORDER — AMLODIPINE BESYLATE 5 MG PO TABS: 5.0000 mg | ORAL_TABLET | Freq: Every day | ORAL | 2 refills | Status: AC

## 2024-03-12 MED ORDER — PREDNISONE 20 MG PO TABS
ORAL_TABLET | ORAL | Status: AC
  Filled 2024-03-12: qty 3

## 2024-03-12 MED ORDER — PREDNISONE 50 MG PO TABS: ORAL_TABLET | ORAL | 0 refills | Status: AC

## 2024-03-12 MED ORDER — DOXYCYCLINE HYCLATE 100 MG PO CAPS: 100.0000 mg | ORAL_CAPSULE | Freq: Two times a day (BID) | ORAL | 0 refills | Status: AC

## 2024-03-12 MED ORDER — PREDNISONE 20 MG PO TABS: 60.0000 mg | ORAL_TABLET | Freq: Every day | ORAL | Status: DC

## 2024-03-12 MED ORDER — DOXYCYCLINE HYCLATE 100 MG PO TABS: 100.0000 mg | ORAL_TABLET | Freq: Once | ORAL | Status: DC

## 2024-03-12 MED ORDER — IPRATROPIUM-ALBUTEROL 0.5-2.5 (3) MG/3ML IN SOLN
3.0000 mL | Freq: Once | RESPIRATORY_TRACT | Status: AC
  Administered 2024-03-12: 3 mL via RESPIRATORY_TRACT

## 2024-03-12 MED ORDER — IPRATROPIUM-ALBUTEROL 0.5-2.5 (3) MG/3ML IN SOLN
RESPIRATORY_TRACT | Status: AC
  Filled 2024-03-12: qty 3

## 2024-03-12 MED ORDER — ALBUTEROL SULFATE HFA 108 (90 BASE) MCG/ACT IN AERS: 2.0000 | INHALATION_SPRAY | Freq: Four times a day (QID) | RESPIRATORY_TRACT | 2 refills | Status: AC | PRN

## 2024-03-12 NOTE — Discharge Instructions (Addendum)
 See your provider for recheck and to set up a ct scan of your chest.  Your xray shows a possible lung nodule.

## 2024-03-12 NOTE — ED Triage Notes (Signed)
 Patient here today with c/o fever, body aches, cough, fatigue, chest discomfort, wheeze, SOB, headaches, and chills since Saturday. She has been taking Advil with some relief. No known sick contacts. No recent travel.

## 2024-03-13 NOTE — ED Provider Notes (Signed)
 [facility]    CSN: [id number] Arrival date & time: 03/12/24  1856      History   Chief Complaint Chief Complaint  Patient presents with   Fever    HPI [name] is a 53 y.o. female.   Patient complains of a fever cough and congestion.  Patient reports shortness of breath today.  She has a history of asthma.  Patient reports that she is out of her albuterol  inhaler.  Patient is a smoker.  Patient reports she has felt similar to this in the past when she has had bronchitis.  The history is provided by the patient. No language interpreter was used.  Fever   Past Medical History:  Diagnosis Date   Asthma    Hypertension     Patient Active Problem List   Diagnosis Date Noted   Essential hypertension 06/14/2015   Asthma 09/26/2011   TOBACCO DEPENDENCE 11/08/2006   SCOLIOSIS 11/08/2006    Past Surgical History:  Procedure Laterality Date   EXTERNAL FIXATION LEG  09/26/2011   Procedure: EXTERNAL FIXATION LEG;  Surgeon: Ozell VEAR Bruch, MD;  Location: [facility] OR;  Service: Orthopedics;  Laterality: Left;   Removal of Hardware Left Tibia with application wound vac   FOOT FRACTURE SURGERY     pins due to too much movement   FRACTURE SURGERY     8/12,9/12    OB History     Gravida  2   Para  1   Term  1   Preterm  0   AB  1   Living  1      SAB  0   IAB  1   Ectopic  0   Multiple  0   Live Births               Home Medications    Prior to Admission medications   Medication Sig Start Date End Date Taking? Authorizing Provider  albuterol  (VENTOLIN  HFA) 108 (90 Base) MCG/ACT inhaler Inhale 2 puffs into the lungs every 6 (six) hours as needed for wheezing or shortness of breath. 03/12/24  Yes Paislee Szatkowski K, PA-C  amLODipine (NORVASC) 5 MG tablet Take 1 tablet (5 mg total) by mouth daily. 03/12/24 04/11/24 Yes Jjesus Dingley K, PA-C  doxycycline (VIBRAMYCIN) 100 MG capsule Take 1 capsule (100 mg total) by mouth 2 (two) times daily. 03/12/24   Yes Maliyah Willets K, PA-C  ibuprofen (ADVIL) 200 MG tablet Take 200 mg by mouth every 6 (six) hours as needed.   Yes [provider]  predniSONE  (DELTASONE ) 50 MG tablet One tablet a day 03/12/24  Yes Alfonsa Vaile K, PA-C  losartan  (COZAAR ) 50 MG tablet Take 1 tablet (50 mg total) by mouth daily. Patient not taking: Reported on 03/12/2024 07/28/19   Antonette Angeline ORN, NP    Family History Family History  Problem Relation Age of Onset   Hypertension Mother    Cancer Mother    Hypertension Father    Diabetes Father    Heart disease Father    Stroke Father    Bladder Cancer Father     Social History Social History   Tobacco Use   Smoking status: Every Day    Current packs/day: 0.50    Average packs/day: 0.5 packs/day for 25.0 years (12.5 ttl pk-yrs)    Types: Cigarettes   Smokeless tobacco: Never  Substance Use Topics   Alcohol use: Yes    Alcohol/week: 5.0 - 6.0 standard drinks  of alcohol    Types: 5 - 6 Cans of beer per week    Comment: weekends   Drug use: Yes    Types: Marijuana    Comment: marijuana occassionally, last used 3 weeks ago     Allergies   Codeine    Review of Systems Review of Systems  Constitutional:  Positive for fever.  All other systems reviewed and are negative.    Physical Exam Triage Vital Signs ED Triage Vitals  Encounter Vitals Group     BP 03/12/24 1905 (!) 190/100     Girls Systolic BP Percentile --      Girls Diastolic BP Percentile --      Boys Systolic BP Percentile --      Boys Diastolic BP Percentile --      Pulse Rate 03/12/24 1905 96     Resp 03/12/24 1905 16     Temp 03/12/24 1905 100 F (37.8 C)     Temp Source 03/12/24 1905 Oral     SpO2 03/12/24 1905 (!) 89 %     Weight --      Height --      Head Circumference --      Peak Flow --      Pain Score 03/12/24 1906 7     Pain Loc --      Pain Education --      Exclude from Growth Chart --    No data found.  Updated Vital Signs BP (!) 190/100 (BP Location:  Right Arm)   Pulse 96   Temp 100 F (37.8 C) (Oral)   Resp 16   LMP 08/12/2017   SpO2 96%   Visual Acuity Right Eye Distance:   Left Eye Distance:   Bilateral Distance:    Right Eye Near:   Left Eye Near:    Bilateral Near:     Physical Exam Vitals and nursing note reviewed.  Constitutional:      Appearance: She is well-developed.  HENT:     Head: Normocephalic.     Nose: Nose normal.     Mouth/Throat:     Mouth: Mucous membranes are moist.  Cardiovascular:     Rate and Rhythm: Normal rate.  Pulmonary:     Breath sounds: Wheezing present.  Abdominal:     General: There is no distension.  Musculoskeletal:        General: Normal range of motion.     Cervical back: Normal range of motion.  Skin:    General: Skin is warm.  Neurological:     General: No focal deficit present.     Mental Status: She is alert and oriented to person, place, and time.      UC Treatments / Results  Labs (all labs ordered are listed, but only abnormal results are displayed) Labs Reviewed - No data to display  EKG   Radiology DG Chest 2 View Result Date: 03/12/2024 CLINICAL DATA:  Cough, fever, body aches EXAM: CHEST - 2 VIEW COMPARISON:  02/15/2015 FINDINGS: Stable cardiomediastinal silhouette. Question nodule versus projection artifact in the left upper lung. No pleural effusion or pneumothorax. No displaced rib fractures. Remote bilateral rib fractures. IMPRESSION: 1. Question nodule versus projection artifact in the left upper lung. CT is recommended for further evaluation. Electronically Signed   By: Norman Gatlin M.D.   On: 03/12/2024 19:55    Procedures Procedures (including critical care time)  Medications Ordered in UC Medications  ipratropium-albuterol  (DUONEB) 0.5-2.5 (3) MG/3ML nebulizer solution  3 mL (3 mLs Nebulization Given 03/12/24 1918)    Initial Impression / Assessment and Plan / UC Course  I have reviewed the triage vital signs and the nursing  notes.  Pertinent labs & imaging results that were available during my care of the patient were reviewed by me and considered in my medical decision making (see chart for details).     Patient given a DuoNeb.  She reports feeling much better oxygen saturation improved from 89 to 96% on reexam patient's lungs are clear.  Chest x-ray shows a possible pulmonary nodule.  I discussed with patient the need for follow-up CT scan.  Patient is given a prescription for doxycycline albuterol  and prednisone .  Patient has a primary care physician that she has not seen for an extended period of time.  Patient is advised to see that provider are scheduled to see a different primary care provider.  She understands the importance of follow-up.  Patient is given information on pulmonary nodules.  Patient is discharged much improved. Final Clinical Impressions(s) / UC Diagnoses   Final diagnoses:  Moderate persistent asthma without complication  Acute bronchitis, unspecified organism  Essential hypertension     Discharge Instructions      See your provider for recheck and to set up a ct scan of your chest.  Your xray shows a possible lung nodule.     ED Prescriptions     Medication Sig Dispense Auth. Provider   doxycycline (VIBRAMYCIN) 100 MG capsule Take 1 capsule (100 mg total) by mouth 2 (two) times daily. 20 capsule Reneta Niehaus K, PA-C   predniSONE  (DELTASONE ) 50 MG tablet One tablet a day 5 tablet Rivers Gassmann K, PA-C   albuterol  (VENTOLIN  HFA) 108 (90 Base) MCG/ACT inhaler Inhale 2 puffs into the lungs every 6 (six) hours as needed for wheezing or shortness of breath. 8 g Ivin Rosenbloom K, PA-C   amLODipine (NORVASC) 5 MG tablet Take 1 tablet (5 mg total) by mouth daily. 30 tablet Abigail Marsiglia K, PA-C      PDMP not reviewed this encounter. An After Visit Summary was printed and given to the patient.       Flint Sonny POUR, PA-C 03/13/24 1113
